# Patient Record
Sex: Male | Born: 1935 | Race: White | Hispanic: No | Marital: Married | Smoking: Former smoker
Health system: Southern US, Community
[De-identification: ages and names within clinical notes are randomized; demographics above are authoritative.]

## PROBLEM LIST (undated history)

## (undated) DIAGNOSIS — Z8673 Personal history of transient ischemic attack (TIA), and cerebral infarction without residual deficits: Secondary | ICD-10-CM

## (undated) DIAGNOSIS — E785 Hyperlipidemia, unspecified: Secondary | ICD-10-CM

## (undated) DIAGNOSIS — N400 Enlarged prostate without lower urinary tract symptoms: Secondary | ICD-10-CM

## (undated) DIAGNOSIS — N200 Calculus of kidney: Secondary | ICD-10-CM

## (undated) DIAGNOSIS — R413 Other amnesia: Secondary | ICD-10-CM

## (undated) DIAGNOSIS — G9689 Other specified disorders of central nervous system: Secondary | ICD-10-CM

## (undated) DIAGNOSIS — F4310 Post-traumatic stress disorder, unspecified: Secondary | ICD-10-CM

## (undated) DIAGNOSIS — I6789 Other cerebrovascular disease: Secondary | ICD-10-CM

## (undated) HISTORY — DX: Other specified disorders of central nervous system: G96.89

## (undated) HISTORY — DX: Other amnesia: R41.3

## (undated) HISTORY — DX: Hyperlipidemia, unspecified: E78.5

## (undated) HISTORY — DX: Benign prostatic hyperplasia without lower urinary tract symptoms: N40.0

## (undated) HISTORY — PX: TONSILLECTOMY: SUR1361

## (undated) HISTORY — DX: Other cerebrovascular disease: I67.89

## (undated) HISTORY — PX: KNEE SURGERY: SHX244

## (undated) HISTORY — DX: Calculus of kidney: N20.0

## (undated) HISTORY — DX: Post-traumatic stress disorder, unspecified: F43.10

## (undated) HISTORY — DX: Personal history of transient ischemic attack (TIA), and cerebral infarction without residual deficits: Z86.73

---

## 2000-06-09 ENCOUNTER — Encounter: Payer: Self-pay | Admitting: *Deleted

## 2000-06-09 ENCOUNTER — Encounter: Admission: RE | Admit: 2000-06-09 | Discharge: 2000-06-09 | Payer: Self-pay | Admitting: *Deleted

## 2004-03-20 ENCOUNTER — Ambulatory Visit (HOSPITAL_COMMUNITY): Admission: RE | Admit: 2004-03-20 | Discharge: 2004-03-20 | Payer: Self-pay | Admitting: Internal Medicine

## 2004-05-07 ENCOUNTER — Ambulatory Visit (HOSPITAL_COMMUNITY): Admission: RE | Admit: 2004-05-07 | Discharge: 2004-05-07 | Payer: Self-pay | Admitting: Urology

## 2005-07-09 ENCOUNTER — Ambulatory Visit: Payer: Self-pay | Admitting: Internal Medicine

## 2005-07-26 ENCOUNTER — Ambulatory Visit: Payer: Self-pay | Admitting: Internal Medicine

## 2005-08-02 ENCOUNTER — Ambulatory Visit: Payer: Self-pay | Admitting: Internal Medicine

## 2005-10-25 ENCOUNTER — Ambulatory Visit: Payer: Self-pay | Admitting: Internal Medicine

## 2005-10-27 ENCOUNTER — Ambulatory Visit: Payer: Self-pay | Admitting: Internal Medicine

## 2007-11-14 ENCOUNTER — Ambulatory Visit: Payer: Self-pay | Admitting: Internal Medicine

## 2007-11-14 DIAGNOSIS — N400 Enlarged prostate without lower urinary tract symptoms: Secondary | ICD-10-CM | POA: Insufficient documentation

## 2007-11-14 DIAGNOSIS — M109 Gout, unspecified: Secondary | ICD-10-CM

## 2007-11-14 DIAGNOSIS — E785 Hyperlipidemia, unspecified: Secondary | ICD-10-CM | POA: Insufficient documentation

## 2008-07-18 ENCOUNTER — Encounter: Payer: Self-pay | Admitting: Internal Medicine

## 2008-09-17 ENCOUNTER — Ambulatory Visit (HOSPITAL_BASED_OUTPATIENT_CLINIC_OR_DEPARTMENT_OTHER): Admission: RE | Admit: 2008-09-17 | Discharge: 2008-09-17 | Payer: Self-pay | Admitting: Orthopedic Surgery

## 2009-11-12 ENCOUNTER — Ambulatory Visit: Payer: Self-pay | Admitting: Internal Medicine

## 2009-11-12 DIAGNOSIS — R0989 Other specified symptoms and signs involving the circulatory and respiratory systems: Secondary | ICD-10-CM

## 2009-11-12 DIAGNOSIS — Z87891 Personal history of nicotine dependence: Secondary | ICD-10-CM

## 2009-11-13 ENCOUNTER — Ambulatory Visit: Payer: Self-pay | Admitting: Internal Medicine

## 2009-11-17 LAB — CONVERTED CEMR LAB
ALT: 21 units/L (ref 0–53)
AST: 24 units/L (ref 0–37)
Albumin: 3.9 g/dL (ref 3.5–5.2)
Alkaline Phosphatase: 65 units/L (ref 39–117)
BUN: 16 mg/dL (ref 6–23)
Basophils Absolute: 0 10*3/uL (ref 0.0–0.1)
Basophils Relative: 0.5 % (ref 0.0–3.0)
Bilirubin, Direct: 0.1 mg/dL (ref 0.0–0.3)
CO2: 27 meq/L (ref 19–32)
Calcium: 9.4 mg/dL (ref 8.4–10.5)
Chloride: 111 meq/L (ref 96–112)
Cholesterol: 221 mg/dL — ABNORMAL HIGH (ref 0–200)
Creatinine, Ser: 1.1 mg/dL (ref 0.4–1.5)
Direct LDL: 147.3 mg/dL
Eosinophils Absolute: 0.1 10*3/uL (ref 0.0–0.7)
Eosinophils Relative: 1.9 % (ref 0.0–5.0)
GFR calc non Af Amer: 70.1 mL/min (ref >60)
Glucose, Bld: 97 mg/dL (ref 70–99)
HCT: 47.1 % (ref 39.0–52.0)
HDL: 48.7 mg/dL (ref >39.00)
Hemoglobin: 15.2 g/dL (ref 13.0–17.0)
Lymphocytes Relative: 18.7 % (ref 12.0–46.0)
Lymphs Abs: 1 10*3/uL (ref 0.7–4.0)
MCHC: 32.3 g/dL (ref 30.0–36.0)
MCV: 99.2 fL (ref 78.0–100.0)
Monocytes Absolute: 0.5 10*3/uL (ref 0.1–1.0)
Monocytes Relative: 9.5 % (ref 3.0–12.0)
Neutro Abs: 3.7 10*3/uL (ref 1.4–7.7)
Neutrophils Relative %: 69.4 % (ref 43.0–77.0)
Platelets: 212 10*3/uL (ref 150.0–400.0)
Potassium: 3.9 meq/L (ref 3.5–5.1)
RBC: 4.75 M/uL (ref 4.22–5.81)
RDW: 12.7 % (ref 11.5–14.6)
Sodium: 145 meq/L (ref 135–145)
TSH: 2.5 microintl units/mL (ref 0.35–5.50)
Total Bilirubin: 1 mg/dL (ref 0.3–1.2)
Total CHOL/HDL Ratio: 5
Total Protein: 7.5 g/dL (ref 6.0–8.3)
Triglycerides: 96 mg/dL (ref 0.0–149.0)
VLDL: 19.2 mg/dL (ref 0.0–40.0)
WBC: 5.3 10*3/uL (ref 4.5–10.5)

## 2009-11-21 ENCOUNTER — Encounter: Payer: Self-pay | Admitting: Internal Medicine

## 2009-11-25 ENCOUNTER — Ambulatory Visit: Payer: Self-pay

## 2009-11-26 ENCOUNTER — Encounter: Payer: Self-pay | Admitting: Internal Medicine

## 2009-12-15 ENCOUNTER — Encounter: Payer: Self-pay | Admitting: Internal Medicine

## 2010-11-01 ENCOUNTER — Encounter: Payer: Self-pay | Admitting: Internal Medicine

## 2010-11-03 ENCOUNTER — Encounter: Payer: Self-pay | Admitting: Internal Medicine

## 2010-11-04 ENCOUNTER — Encounter: Payer: Self-pay | Admitting: Internal Medicine

## 2010-11-10 ENCOUNTER — Encounter
Admission: RE | Admit: 2010-11-10 | Discharge: 2010-11-10 | Payer: Self-pay | Source: Home / Self Care | Attending: Gastroenterology | Admitting: Gastroenterology

## 2010-12-06 LAB — CONVERTED CEMR LAB
ALT: 22 units/L (ref 0–53)
AST: 25 units/L (ref 0–37)
Albumin: 3.9 g/dL (ref 3.5–5.2)
Alkaline Phosphatase: 58 units/L (ref 39–117)
BUN: 18 mg/dL (ref 6–23)
Bacteria, UA: NEGATIVE
Basophils Absolute: 0 10*3/uL (ref 0.0–0.1)
Basophils Relative: 0.7 % (ref 0.0–1.0)
Bilirubin Urine: NEGATIVE
Bilirubin, Direct: 0.2 mg/dL (ref 0.0–0.3)
CO2: 28 meq/L (ref 19–32)
Calcium: 9 mg/dL (ref 8.4–10.5)
Chloride: 105 meq/L (ref 96–112)
Cholesterol: 201 mg/dL (ref 0–200)
Creatinine, Ser: 1 mg/dL (ref 0.4–1.5)
Crystals: NEGATIVE
Direct LDL: 119.2 mg/dL
Eosinophils Absolute: 0.1 10*3/uL (ref 0.0–0.6)
Eosinophils Relative: 2.9 % (ref 0.0–5.0)
GFR calc Af Amer: 95 mL/min
GFR calc non Af Amer: 79 mL/min
Glucose, Bld: 104 mg/dL — ABNORMAL HIGH (ref 70–99)
HCT: 43.9 % (ref 39.0–52.0)
HDL: 39.2 mg/dL (ref >39.0)
Hemoglobin, Urine: NEGATIVE
Hemoglobin: 15.1 g/dL (ref 13.0–17.0)
Ketones, ur: NEGATIVE mg/dL
Leukocytes, UA: NEGATIVE
Lymphocytes Relative: 11.6 % — ABNORMAL LOW (ref 12.0–46.0)
MCHC: 34.5 g/dL (ref 30.0–36.0)
MCV: 94.6 fL (ref 78.0–100.0)
Monocytes Absolute: 0.7 10*3/uL (ref 0.2–0.7)
Monocytes Relative: 13.2 % — ABNORMAL HIGH (ref 3.0–11.0)
Neutro Abs: 3.7 10*3/uL (ref 1.4–7.7)
Neutrophils Relative %: 71.6 % (ref 43.0–77.0)
Nitrite: NEGATIVE
PSA: 0.82 ng/mL (ref 0.10–4.00)
Platelets: 212 10*3/uL (ref 150–400)
Potassium: 3.8 meq/L (ref 3.5–5.1)
RBC / HPF: NONE SEEN
RBC: 4.64 M/uL (ref 4.22–5.81)
RDW: 12.8 % (ref 11.5–14.6)
Sodium: 139 meq/L (ref 135–145)
Specific Gravity, Urine: >=1.03 (ref 1.000–1.03)
TSH: 1.97 microintl units/mL (ref 0.35–5.50)
Total Bilirubin: 0.9 mg/dL (ref 0.3–1.2)
Total CHOL/HDL Ratio: 5.1
Total Protein, Urine: NEGATIVE mg/dL
Total Protein: 7.1 g/dL (ref 6.0–8.3)
Triglycerides: 108 mg/dL (ref 0–149)
Urine Glucose: NEGATIVE mg/dL
Urobilinogen, UA: 0.2 (ref 0.0–1.0)
VLDL: 22 mg/dL (ref 0–40)
WBC: 5.1 10*3/uL (ref 4.5–10.5)
pH: 5.5 (ref 5.0–8.0)

## 2010-12-08 NOTE — Miscellaneous (Signed)
Summary: Orders Update  Clinical Lists Changes  Orders: Added new Test order of Carotid Duplex (Carotid Duplex) - Signed 

## 2010-12-08 NOTE — Assessment & Plan Note (Signed)
Summary: COMPLETE PHYSICAL-LB   Vital Signs:  Patient profile:   75 year old male Height:      70 inches Weight:      207 pounds BMI:     29.81 Temp:     98.3 degrees F oral Pulse rate:   68 / minute BP sitting:   120 / 80  (left arm) CC: cpx Is Patient Diabetic? No   CC:  cpx.  History of Present Illness: The patient presents for a wellness examination   Preventive Screening-Counseling & Management  Alcohol-Tobacco     Smoking Status: quit  Caffeine-Diet-Exercise     Does Patient Exercise: yes  Current Medications (verified): 1)  Flomax 0.4 Mg Cp24 (Tamsulosin Hcl) .... Take 1 Capsule By Mouth Daily 2)  Vitamin D3 1000 Unit  Tabs (Cholecalciferol) .Marland Kitchen.. 1 Qd 3)  Aspir-Low 81 Mg Tbec (Aspirin) .Marland Kitchen.. 1 Once Daily After Meal 4)  Fish Oil   Oil (Fish Oil) .Marland Kitchen.. 1 By Mouth Bid  Allergies: 1)  Lovastatin (Lovastatin)  Past History:  Past Medical History: Last updated: 11/14/2007 Kidney stones   Dr Serena Colonel Gout Hyperlipidemia (intol. of statins) Benign prostatic hypertrophy    GI   Dr Kinnie Scales  Family History: Last updated: 11/14/2007 Family History of Colon CA 1st degree relative <60 F Alzheimer  Past Surgical History: Denies surgical history  Social History: Retired Librarian, academic Married Never Smoked Regular exercise-yes Smoking Status:  quit Does Patient Exercise:  yes  Review of Systems  The patient denies anorexia, fever, weight loss, weight gain, vision loss, decreased hearing, hoarseness, chest pain, syncope, dyspnea on exertion, peripheral edema, prolonged cough, headaches, hemoptysis, abdominal pain, melena, hematochezia, severe indigestion/heartburn, hematuria, incontinence, genital sores, muscle weakness, suspicious skin lesions, transient blindness, difficulty walking, depression, unusual weight change, abnormal bleeding, enlarged lymph nodes, angioedema, and testicular masses.    Physical Exam  General:  Well-developed,well-nourished,in  no acute distress; alert,appropriate and cooperative throughout examination Head:  Normocephalic and atraumatic without obvious abnormalities. No apparent alopecia or balding. Eyes:  No corneal or conjunctival inflammation noted. EOMI. Perrla. Ears:  External ear exam shows no significant lesions or deformities.  Otoscopic examination reveals clear canals, tympanic membranes are intact bilaterally without bulging, retraction, inflammation or discharge. Hearing is grossly normal bilaterally. Nose:  External nasal examination shows no deformity or inflammation. Nasal mucosa are pink and moist without lesions or exudates. Mouth:  Oral mucosa and oropharynx without lesions or exudates.  Teeth in good repair. Neck:  mild bruit B Lungs:  Normal respiratory effort, chest expands symmetrically. Lungs are clear to auscultation, no crackles or wheezes. Heart:  Normal rate and regular rhythm. S1 and S2 normal without gallop, murmur, click, rub or other extra sounds. Abdomen:  Bowel sounds positive,abdomen soft and non-tender without masses, organomegaly or hernias noted. Rectal:  seeing urol Genitalia:  per urol Prostate:  per urol Msk:  No deformity or scoliosis noted of thoracic or lumbar spine.   Pulses:  R and L carotid,radial,femoral,dorsalis pedis and posterior tibial pulses are full and equal bilaterally Extremities:  No clubbing, cyanosis, edema, or deformity noted with normal full range of motion of all joints.   Neurologic:  No cranial nerve deficits noted. Station and gait are normal. Plantar reflexes are down-going bilaterally. DTRs are symmetrical throughout. Sensory, motor and coordinative functions appear intact. Skin:  Intact without suspicious lesions or rashes Cervical Nodes:  No lymphadenopathy noted Inguinal Nodes:  No significant adenopathy Psych:  Cognition and judgment appear intact. Alert and cooperative  with normal attention span and concentration. No apparent delusions, illusions,  hallucinations   Impression & Recommendations:  Problem # 1:  WELL ADULT EXAM (ICD-V70.0) Assessment New The labs were ordered.  Health and age related issues were discussed. Available screening tests and vaccinations were discussed as well. Healthy life style including good diet and execise was discussed.  Colon Dr Kinnie Scales  Orders: EKG w/ Interpretation (93000)  Problem # 2:  BENIGN PROSTATIC HYPERTROPHY (ICD-600.00) Assessment: Unchanged Appt w/Dr K next wk is pending  His updated medication list for this problem includes:    Flomax 0.4 Mg Cp24 (Tamsulosin hcl) .Marland Kitchen... Take 1 capsule by mouth daily  Problem # 3:  HYPERLIPIDEMIA (ICD-272.4) mild Assessment: Comment Only Good diet  Complete Medication List: 1)  Flomax 0.4 Mg Cp24 (Tamsulosin hcl) .... Take 1 capsule by mouth daily 2)  Vitamin D3 1000 Unit Tabs (Cholecalciferol) .Marland Kitchen.. 1 qd 3)  Aspir-low 81 Mg Tbec (Aspirin) .Marland Kitchen.. 1 once daily after meal 4)  Fish Oil Oil (Fish oil) .Marland Kitchen.. 1 by mouth bid  Other Orders: Doppler Referral (Doppler)  Contraindications/Deferment of Procedures/Staging:    Test/Procedure: DPT vaccine    Reason for deferment: declined   Patient Instructions: 1)  Try to eat more raw plant food, fresh and dry fruit, raw almonds, leafy vegetables, whole foods and less red meat, less animal fat. Poultry and fish is better for you than pork and beef. Avoid processed foods (canned soups, hot dogs, sausage, bacon , frozen dinners). Avoid corn syrup, high fructose syrup or aspartam and Splenda  containing drinks. Honey, Agave and Stevia are better sweeteners. Make your own  dressing with olive oil, wine vinegar, lemon juce, garlic etc. for your salads. 2)  Labs tomorrow: 3)  BMP prior to visit, ICD-9: 4)  Hepatic Panel prior to visit, ICD-9: 5)  Lipid Panel prior to visit, ICD-9: 6)  TSH prior to visit, ICD-9: v70.0 7)  CBC w/ Diff prior to visit, ICD-9:

## 2010-12-08 NOTE — Letter (Signed)
Summary: [facility]  [facility]   Imported By: Sherian Rein 12/24/2009 10:26:56  _____________________________________________________________________  External Attachment:    Type:   Image     Comment:   External Document

## 2010-12-10 NOTE — Letter (Signed)
Summary: Currie Paris MD  [facility]rie Paris MD   Imported By: Lester Staunton 11/23/2010 09:12:39  _____________________________________________________________________  External Attachment:    Type:   Image     Comment:   External Document

## 2010-12-10 NOTE — Letter (Signed)
Summary: [facility]   Imported By: Lennie Odor 11/25/2010 12:03:04  _____________________________________________________________________  External Attachment:    Type:   Image     Comment:   External Document

## 2010-12-16 ENCOUNTER — Encounter: Payer: Self-pay | Admitting: Internal Medicine

## 2010-12-30 NOTE — Letter (Signed)
Summary: [facility]   Imported By: Sherian Rein 12/23/2010 08:40:55  _____________________________________________________________________  External Attachment:    Type:   Image     Comment:   External Document

## 2011-03-23 NOTE — Op Note (Signed)
Jacob Figueroa, ROSENCRANS                  ACCOUNT NO.:  [id number]   MEDICAL RECORD NO.:  [id number]          PATIENT TYPE:  AMB   LOCATION:  [facility]                          FACILITY:  [facility]   PHYSICIAN:  Robert A. Thurston Hole, M.D. DATE OF BIRTH:  [birth year]   DATE OF PROCEDURE:  DATE OF DISCHARGE:                               OPERATIVE REPORT   PREOPERATIVE DIAGNOSIS:  Left knee medial and lateral meniscal tears.   POSTOPERATIVE DIAGNOSIS:  Left knee medial and lateral meniscal tears.   PROCEDURE:  Left knee examination under anesthesia followed by  arthroscopic partial medial and lateral meniscectomies.   SURGEON:  Elana Alm. Thurston Hole, M.D.   ANESTHESIA:  General.   OPERATIVE TIME:  30 minutes.   COMPLICATIONS:  None.   INDICATION FOR PROCEDURE:  Mr. Cowher is a 75 year old gentleman who has  had significant left knee pain for the past 4-5 months, increasing in  nature with exam and MRI documenting meniscal tearing.  He has failed  conservative care and is now to undergo arthroscopy.   DESCRIPTION:  Mr. Streett was brought to the operating room on September 17, 2008, placed on operating table in supine position.  After adequate  level of general anesthesia was obtained, his left knee was examined.  He had full range of motion.  His knee was stable to ligamentous exam  with normal patellar tracking.  The knee was sterilely injected with  0.25% Marcaine with epinephrine.  The left leg was prepped using sterile  DuraPrep and draped using sterile technique.  Originally, through an  anterolateral portal, the arthroscope with a pump attached was placed  and through an anteromedial portal, an arthroscopic probe was placed.  On initial inspection of the medial compartment, he had grade 1 and 2  chondromalacia.  Medial meniscus showed a complex tear in the posterior  and medial horn, of which 50% was resected back to stable, but  degenerative rim.  Intercondylar notch inspected.  Anterior and  posterior cruciate ligaments were normal.  Lateral compartment was  inspected.  The articular cartilage was normal.  Lateral meniscus showed  a small partial tear of 15% in posterolateral corner, which was resected  back to a stable rim.  Patellofemoral joint and articular cartilage  showed grade 1 and 2 chondromalacia.  The patella tracked normally.  Mild synovitis in the medial gutter was debrided, otherwise, the medial  and lateral gutters were free of pathology, normal patella tracking was  noted.  After this was done, it was felt that all pathology have been  satisfactorily addressed.  The instruments were removed.  The portals  were closed with 3-0 nylon suture and injected with 0.25% Marcaine with  epinephrine and 4 mg of morphine.  Sterile dressings were applied, and  the patient awakened and taken to the recovery room in stable condition.   FOLLOWUP CARE:  Mr. Moscato will be followed as an outpatient on Vicodin  and Mobic.  He will be seen back in the office in a week for sutures out  and followup.      Robert A.  Thurston Hole, M.D.  Electronically Signed     RAW/MEDQ  D:  09/17/2008  T:  09/18/2008  Job:  [id number]

## 2011-09-02 ENCOUNTER — Telehealth: Payer: Self-pay | Admitting: *Deleted

## 2011-09-02 DIAGNOSIS — Z0389 Encounter for observation for other suspected diseases and conditions ruled out: Secondary | ICD-10-CM

## 2011-09-02 DIAGNOSIS — Z0000 Encounter for general adult medical examination without abnormal findings: Secondary | ICD-10-CM

## 2011-09-02 NOTE — Telephone Encounter (Signed)
Labs entered.

## 2011-09-02 NOTE — Telephone Encounter (Signed)
Message copied by Merrilyn Puma on Thu Sep 02, 2011  9:49 AM ------      Message from: Earl Lagos      Created: Wed Sep 01, 2011  4:56 PM      Regarding: labs       Please schedule labs for cpx 11/08/11. Thanks

## 2011-10-11 ENCOUNTER — Other Ambulatory Visit (INDEPENDENT_AMBULATORY_CARE_PROVIDER_SITE_OTHER): Payer: Self-pay

## 2011-10-11 ENCOUNTER — Other Ambulatory Visit: Payer: Self-pay | Admitting: Internal Medicine

## 2011-10-11 DIAGNOSIS — Z0000 Encounter for general adult medical examination without abnormal findings: Secondary | ICD-10-CM

## 2011-10-11 DIAGNOSIS — Z0389 Encounter for observation for other suspected diseases and conditions ruled out: Secondary | ICD-10-CM

## 2011-10-11 LAB — BASIC METABOLIC PANEL
BUN: 15 mg/dL (ref 6–23)
CO2: 27 mEq/L (ref 19–32)
Chloride: 106 mEq/L (ref 96–112)
Creatinine, Ser: 0.9 mg/dL (ref 0.4–1.5)
Glucose, Bld: 96 mg/dL (ref 70–99)
Potassium: 4 mEq/L (ref 3.5–5.1)

## 2011-10-11 LAB — URINALYSIS, ROUTINE W REFLEX MICROSCOPIC
Hgb urine dipstick: NEGATIVE
Total Protein, Urine: NEGATIVE
Urine Glucose: NEGATIVE
WBC, UA: NONE SEEN (ref 0–?)
pH: 6 (ref 5.0–8.0)

## 2011-10-11 LAB — CBC WITH DIFFERENTIAL/PLATELET
Basophils Relative: 0.2 % (ref 0.0–3.0)
Eosinophils Absolute: 0.1 10*3/uL (ref 0.0–0.7)
Eosinophils Relative: 1.1 % (ref 0.0–5.0)
Hemoglobin: 14.2 g/dL (ref 13.0–17.0)
Lymphocytes Relative: 14.1 % (ref 12.0–46.0)
MCHC: 33.9 g/dL (ref 30.0–36.0)
Monocytes Relative: 7.7 % (ref 3.0–12.0)
Neutro Abs: 4.6 10*3/uL (ref 1.4–7.7)
RBC: 4.36 Mil/uL (ref 4.22–5.81)
WBC: 6 10*3/uL (ref 4.5–10.5)

## 2011-10-11 LAB — HEPATIC FUNCTION PANEL
AST: 30 U/L (ref 0–37)
Alkaline Phosphatase: 63 U/L (ref 39–117)
Bilirubin, Direct: 0.1 mg/dL (ref 0.0–0.3)
Total Bilirubin: 0.5 mg/dL (ref 0.3–1.2)

## 2011-10-11 LAB — LIPID PANEL
Total CHOL/HDL Ratio: 4
VLDL: 38.4 mg/dL (ref 0.0–40.0)

## 2011-11-05 ENCOUNTER — Encounter: Payer: Self-pay | Admitting: Internal Medicine

## 2011-11-08 ENCOUNTER — Ambulatory Visit (INDEPENDENT_AMBULATORY_CARE_PROVIDER_SITE_OTHER): Payer: Medicare Other | Admitting: Internal Medicine

## 2011-11-08 ENCOUNTER — Encounter: Payer: Self-pay | Admitting: Internal Medicine

## 2011-11-08 VITALS — BP 140/88 | HR 80 | Temp 97.5°F | Resp 16 | Ht 70.0 in | Wt 197.0 lb

## 2011-11-08 DIAGNOSIS — Z0000 Encounter for general adult medical examination without abnormal findings: Secondary | ICD-10-CM

## 2011-11-08 DIAGNOSIS — N400 Enlarged prostate without lower urinary tract symptoms: Secondary | ICD-10-CM

## 2011-11-08 DIAGNOSIS — R030 Elevated blood-pressure reading, without diagnosis of hypertension: Secondary | ICD-10-CM

## 2011-11-08 DIAGNOSIS — Z136 Encounter for screening for cardiovascular disorders: Secondary | ICD-10-CM

## 2011-11-08 DIAGNOSIS — Z23 Encounter for immunization: Secondary | ICD-10-CM

## 2011-11-08 MED ORDER — ALLOPURINOL 100 MG PO TABS: 100.0000 mg | ORAL_TABLET | Freq: Every day | ORAL | Status: DC

## 2011-11-08 NOTE — Assessment & Plan Note (Signed)
Cut back on ETOH

## 2011-11-08 NOTE — Progress Notes (Signed)
Subjective:    Patient ID: Jacob Figueroa, male    DOB: 02-27-1936, 75 y.o.   MRN: [id number]  HPI  He The patient is here for a wellness exam. The patient has been doing well overall without major physical or psychological issues going on lately.  The patient needs to address  chronic BPH   Review of Systems  Constitutional: Negative for appetite change, fatigue and unexpected weight change.  HENT: Negative for nosebleeds, congestion, sore throat, sneezing, trouble swallowing and neck pain.   Eyes: Negative for itching and visual disturbance.  Respiratory: Negative for cough.   Cardiovascular: Negative for chest pain, palpitations and leg swelling.  Gastrointestinal: Negative for nausea, diarrhea, blood in stool and abdominal distention.  Genitourinary: Negative for frequency and hematuria.  Musculoskeletal: Negative for back pain, joint swelling and gait problem.  Skin: Negative for rash.  Neurological: Negative for dizziness, tremors, speech difficulty and weakness.  Psychiatric/Behavioral: Negative for sleep disturbance, dysphoric mood and agitation. The patient is not nervous/anxious.    Wt Readings from Last 3 Encounters:  11/08/11 197 lb (89.359 kg)  11/12/09 207 lb (93.895 kg)  11/14/07 214 lb (97.07 kg)       Objective:   Physical Exam  Constitutional: He is oriented to person, place, and time. He appears well-developed and well-nourished. No distress.  HENT:  Head: Normocephalic and atraumatic.  Right Ear: External ear normal.  Left Ear: External ear normal.  Nose: Nose normal.  Mouth/Throat: Oropharynx is clear and moist. No oropharyngeal exudate.  Eyes: Conjunctivae and EOM are normal. Pupils are equal, round, and reactive to light. Right eye exhibits no discharge. Left eye exhibits no discharge. No scleral icterus.  Neck: Normal range of motion. Neck supple. No JVD present. No tracheal deviation present. No thyromegaly present.  Cardiovascular: Normal rate,  regular rhythm, normal heart sounds and intact distal pulses.  Exam reveals no gallop and no friction rub.   No murmur heard. Pulmonary/Chest: Effort normal and breath sounds normal. No stridor. No respiratory distress. He has no wheezes. He has no rales. He exhibits no tenderness.  Abdominal: Soft. Bowel sounds are normal. He exhibits no distension and no mass. There is no tenderness. There is no rebound and no guarding.  Genitourinary: Rectum normal and penis normal. Guaiac negative stool. No penile tenderness.       Prostate 1+  Musculoskeletal: Normal range of motion. He exhibits no edema and no tenderness.  Lymphadenopathy:    He has no cervical adenopathy.  Neurological: He is alert and oriented to person, place, and time. He has normal reflexes. No cranial nerve deficit. He exhibits normal muscle tone. Coordination normal.  Skin: Skin is warm and dry. No rash noted. He is not diaphoretic. No erythema. No pallor.  Psychiatric: He has a normal mood and affect. His behavior is normal. Judgment and thought content normal.     BP Readings from Last 3 Encounters:  11/08/11 140/88  11/12/09 120/80  11/14/07 116/74   Lab Results  Component Value Date   WBC 6.0 10/11/2011   HGB 14.2 10/11/2011   HCT 41.9 10/11/2011   PLT 207.0 10/11/2011   GLUCOSE 96 10/11/2011   CHOL 208* 10/11/2011   TRIG 192.0* 10/11/2011   HDL 49.70 10/11/2011   LDLDIRECT 118.8 10/11/2011   ALT 17 10/11/2011   AST 30 10/11/2011   NA 140 10/11/2011   K 4.0 10/11/2011   CL 106 10/11/2011   CREATININE 0.9 10/11/2011   BUN 15 10/11/2011  CO2 27 10/11/2011   TSH 2.39 10/11/2011   PSA 0.74 10/11/2011        Assessment & Plan:

## 2012-01-21 ENCOUNTER — Encounter: Payer: Self-pay | Admitting: Internal Medicine

## 2012-01-21 NOTE — Assessment & Plan Note (Signed)
Continue with current prescription therapy as reflected on the Med list.  

## 2012-01-21 NOTE — Assessment & Plan Note (Signed)
12/12 The patient is here for annual Medicare wellness examination and management of other chronic and acute problems.   The risk factors are reflected in the social history.  The roster of all physicians providing medical care to patient - is listed in the Snapshot section of the chart.  Activities of daily living:  The patient is 100% inedpendent in all ADLs: dressing, toileting, feeding as well as independent mobility  Home safety : The patient has smoke detectors in the home. They wear seatbelts. Firearms are present in the home, kept in a safe fashion. There is no violence in the home.   There is no risks for hepatitis, STDs or HIV. There is no   history of blood transfusion. They have no travel history to infectious disease endemic areas of the world.  The patient has (has not) seen their dentist in the last six month. They have (not) seen their eye doctor in the last year. They deny (admit to) any hearing difficulty and have not had audiologic testing in the last year.  They do not  have excessive sun exposure. Discussed the need for sun protection: hats, long sleeves and use of sunscreen if there is significant sun exposure.   Diet: the importance of a healthy diet is discussed. They do have a healthy (unhealthy-high fat/fast food) diet.  The patient has a regular exercise program: 7per week.  The benefits of regular aerobic exercise were discussed.  Depression screen: there are no signs or vegative symptoms of depression- irritability, change in appetite, anhedonia, sadness/tearfullness.  Cognitive assessment: the patient manages all their financial and personal affairs and is actively engaged. They could relate day,date,year and events; recalled 3/3 objects at 3 minutes; performed clock-face test normally.  The following portions of the patient's history were reviewed and updated as appropriate: allergies, current medications, past family history, past medical history,  past surgical  history, past social history  and problem list.  Vision, hearing, body mass index were assessed and reviewed.   During the course of the visit the patient was educated and counseled about appropriate screening and preventive services including : fall prevention , diabetes screening, nutrition counseling, colorectal cancer screening, and recommended immunizations.  We discussed alcohol use - limit to 1-2/d

## 2012-04-19 DIAGNOSIS — N200 Calculus of kidney: Secondary | ICD-10-CM | POA: Diagnosis not present

## 2012-04-19 DIAGNOSIS — N139 Obstructive and reflux uropathy, unspecified: Secondary | ICD-10-CM | POA: Diagnosis not present

## 2012-04-19 DIAGNOSIS — N529 Male erectile dysfunction, unspecified: Secondary | ICD-10-CM | POA: Diagnosis not present

## 2012-04-19 DIAGNOSIS — Q619 Cystic kidney disease, unspecified: Secondary | ICD-10-CM | POA: Diagnosis not present

## 2012-06-07 ENCOUNTER — Encounter: Payer: Self-pay | Admitting: Internal Medicine

## 2012-06-07 ENCOUNTER — Ambulatory Visit (INDEPENDENT_AMBULATORY_CARE_PROVIDER_SITE_OTHER): Payer: Medicare Other | Admitting: Internal Medicine

## 2012-06-07 VITALS — BP 140/82 | HR 76 | Temp 98.4°F | Resp 16 | Wt 190.0 lb

## 2012-06-07 DIAGNOSIS — R030 Elevated blood-pressure reading, without diagnosis of hypertension: Secondary | ICD-10-CM | POA: Diagnosis not present

## 2012-06-07 DIAGNOSIS — Z7289 Other problems related to lifestyle: Secondary | ICD-10-CM | POA: Diagnosis not present

## 2012-06-07 DIAGNOSIS — F4310 Post-traumatic stress disorder, unspecified: Secondary | ICD-10-CM

## 2012-06-07 DIAGNOSIS — F22 Delusional disorders: Secondary | ICD-10-CM | POA: Diagnosis not present

## 2012-06-07 MED ORDER — QUETIAPINE FUMARATE ER 50 MG PO TB24: 50.0000 mg | ORAL_TABLET | Freq: Every day | ORAL | Status: DC

## 2012-06-07 MED ORDER — LORAZEPAM 0.5 MG PO TABS: 0.5000 mg | ORAL_TABLET | Freq: Three times a day (TID) | ORAL | Status: AC | PRN

## 2012-06-07 MED ORDER — THIAMINE HCL 100 MG PO TABS: 100.0000 mg | ORAL_TABLET | Freq: Every day | ORAL | Status: DC

## 2012-06-07 NOTE — Assessment & Plan Note (Signed)
He is seeing a psychologist w/wife starting 06/06/12 No alcohol Lorazepam prn

## 2012-06-07 NOTE — Assessment & Plan Note (Signed)
Mild. Will watch 

## 2012-06-07 NOTE — Assessment & Plan Note (Signed)
Thiamine short term Lorazepam prn Cont w/councelling

## 2012-06-07 NOTE — Assessment & Plan Note (Addendum)
New 7/13 PTSD Start Seroquel XR at low dose Psychiatry consult Cont w/councelling Wife will watch him closely

## 2012-06-07 NOTE — Progress Notes (Signed)
  Subjective:    Patient ID: Jacob Figueroa, male    DOB: 1936/02/16, 76 y.o.   MRN: [id number]  HPI  C/o paranoia, agitation, anxiety H/o  PTSD  C/o elevated levels of anxiety and agitation over past 7-10 d. He thought someone would harm him. He had a shot gun prepared. He denies hearing voices or seeing things or messages. He started to drink more for a few days - it made things worse. He had his last drink on Fri last week. No shakes or craving.  No HAs, gait issues, dizziness    Review of Systems  Constitutional: Negative for appetite change, fatigue and unexpected weight change.  HENT: Negative for nosebleeds, congestion, sore throat, sneezing, trouble swallowing and neck pain.   Eyes: Negative for itching and visual disturbance.  Respiratory: Negative for cough.   Cardiovascular: Negative for chest pain, palpitations and leg swelling.  Gastrointestinal: Negative for nausea, diarrhea, blood in stool and abdominal distention.  Genitourinary: Negative for frequency and hematuria.  Musculoskeletal: Negative for back pain, joint swelling and gait problem.  Skin: Negative for rash.  Neurological: Negative for dizziness, tremors, seizures, syncope, speech difficulty, weakness, light-headedness, numbness and headaches.  Hematological: Does not bruise/bleed easily.  Psychiatric/Behavioral: Positive for behavioral problems and decreased concentration. Negative for suicidal ideas, hallucinations, confusion, disturbed wake/sleep cycle, self-injury, dysphoric mood and agitation. The patient is nervous/anxious and is hyperactive.        Objective:   Physical Exam  Constitutional: He is oriented to person, place, and time. He appears well-developed.  HENT:  Mouth/Throat: Oropharynx is clear and moist.  Eyes: Conjunctivae are normal. Pupils are equal, round, and reactive to light.  Neck: Normal range of motion. No JVD present. No thyromegaly present.  Cardiovascular: Normal rate, regular  rhythm, normal heart sounds and intact distal pulses.  Exam reveals no gallop and no friction rub.   No murmur heard. Pulmonary/Chest: Effort normal and breath sounds normal. No respiratory distress. He has no wheezes. He has no rales. He exhibits no tenderness.  Abdominal: Soft. Bowel sounds are normal. He exhibits no distension and no mass. There is no tenderness. There is no rebound and no guarding.  Musculoskeletal: Normal range of motion. He exhibits no edema and no tenderness.  Lymphadenopathy:    He has no cervical adenopathy.  Neurological: He is alert and oriented to person, place, and time. He has normal reflexes. No cranial nerve deficit. He exhibits normal muscle tone. Coordination normal.       A very mild hand tremor  Skin: Skin is warm and dry. No rash noted.  Psychiatric: His behavior is normal. Judgment and thought content normal.       Looks sad, quiet Not suicidal or homicidal          Assessment & Plan:

## 2012-06-08 ENCOUNTER — Other Ambulatory Visit: Payer: Self-pay | Admitting: *Deleted

## 2012-06-08 ENCOUNTER — Telehealth: Payer: Self-pay | Admitting: *Deleted

## 2012-06-08 MED ORDER — THIAMINE HCL 100 MG PO TABS: 100.0000 mg | ORAL_TABLET | Freq: Every day | ORAL | Status: AC

## 2012-06-08 NOTE — Telephone Encounter (Signed)
Pt informed

## 2012-06-08 NOTE — Telephone Encounter (Signed)
Noted Hold Seroquel Take Lorazepam 1 tab bid Start Thiamine 1 tab a day (it was called in) RTC in a few days. Call if not better Thx

## 2012-06-08 NOTE — Telephone Encounter (Signed)
Pt states he is having confusion this morning after taking the Seroquel last night.

## 2012-06-15 ENCOUNTER — Encounter: Payer: Self-pay | Admitting: Internal Medicine

## 2012-06-15 ENCOUNTER — Ambulatory Visit (INDEPENDENT_AMBULATORY_CARE_PROVIDER_SITE_OTHER): Payer: Medicare Other | Admitting: Internal Medicine

## 2012-06-15 VITALS — BP 130/80 | HR 68 | Temp 98.3°F | Resp 16 | Wt 189.0 lb

## 2012-06-15 DIAGNOSIS — F4310 Post-traumatic stress disorder, unspecified: Secondary | ICD-10-CM

## 2012-06-15 DIAGNOSIS — Z7289 Other problems related to lifestyle: Secondary | ICD-10-CM | POA: Diagnosis not present

## 2012-06-15 DIAGNOSIS — F22 Delusional disorders: Secondary | ICD-10-CM

## 2012-06-15 MED ORDER — ESCITALOPRAM OXALATE 5 MG PO TABS: 5.0000 mg | ORAL_TABLET | Freq: Every day | ORAL | Status: DC

## 2012-06-15 NOTE — Assessment & Plan Note (Addendum)
Start Lexapro 5 mg/d In councelling Psych appt is scheduled

## 2012-06-15 NOTE — Assessment & Plan Note (Signed)
Resolved

## 2012-06-15 NOTE — Assessment & Plan Note (Signed)
Not drinking now Thiamin x 1 mo

## 2012-06-15 NOTE — Progress Notes (Signed)
Patient ID: Jacob Figueroa, male   DOB: 07-28-1936, 76 y.o.   MRN: [id number]  Subjective:    Patient ID: Jacob Figueroa, male    DOB: 04-03-1936, 76 y.o.   MRN: [id number]  HPI F/u on  PTSD  Doing much beter. He could not take seroquel No HAs, gait issues, dizziness    Review of Systems  Constitutional: Negative for appetite change, fatigue and unexpected weight change.  HENT: Negative for nosebleeds, congestion, sore throat, sneezing, trouble swallowing and neck pain.   Eyes: Negative for itching and visual disturbance.  Respiratory: Negative for cough.   Cardiovascular: Negative for chest pain, palpitations and leg swelling.  Gastrointestinal: Negative for nausea, diarrhea, blood in stool and abdominal distention.  Genitourinary: Negative for frequency and hematuria.  Musculoskeletal: Negative for back pain, joint swelling and gait problem.  Skin: Negative for rash.  Neurological: Negative for dizziness, tremors, seizures, syncope, speech difficulty, weakness, light-headedness, numbness and headaches.  Hematological: Does not bruise/bleed easily.  Psychiatric/Behavioral: Negative for suicidal ideas, hallucinations, behavioral problems, confusion, disturbed wake/sleep cycle, self-injury, dysphoric mood, decreased concentration and agitation. The patient is not nervous/anxious and is not hyperactive.        Objective:   Physical Exam  Constitutional: He is oriented to person, place, and time. He appears well-developed.  HENT:  Mouth/Throat: Oropharynx is clear and moist.  Eyes: Conjunctivae are normal. Pupils are equal, round, and reactive to light.  Neck: Normal range of motion. No JVD present. No thyromegaly present.  Cardiovascular: Normal rate, regular rhythm, normal heart sounds and intact distal pulses.  Exam reveals no gallop and no friction rub.   No murmur heard. Pulmonary/Chest: Effort normal and breath sounds normal. No respiratory distress. He has no wheezes. He has no  rales. He exhibits no tenderness.  Abdominal: Soft. Bowel sounds are normal. He exhibits no distension and no mass. There is no tenderness. There is no rebound and no guarding.  Musculoskeletal: Normal range of motion. He exhibits no edema and no tenderness.  Lymphadenopathy:    He has no cervical adenopathy.  Neurological: He is alert and oriented to person, place, and time. He has normal reflexes. No cranial nerve deficit. He exhibits normal muscle tone. Coordination normal.       no hand tremor  Skin: Skin is warm and dry. No rash noted.  Psychiatric: His behavior is normal. Judgment and thought content normal.       Looks better Not suicidal or homicidal          Assessment & Plan:

## 2012-09-07 DIAGNOSIS — Z23 Encounter for immunization: Secondary | ICD-10-CM | POA: Diagnosis not present

## 2012-09-11 DIAGNOSIS — L219 Seborrheic dermatitis, unspecified: Secondary | ICD-10-CM | POA: Diagnosis not present

## 2012-09-11 DIAGNOSIS — D239 Other benign neoplasm of skin, unspecified: Secondary | ICD-10-CM | POA: Diagnosis not present

## 2012-09-11 DIAGNOSIS — L570 Actinic keratosis: Secondary | ICD-10-CM | POA: Diagnosis not present

## 2013-04-30 DIAGNOSIS — Q619 Cystic kidney disease, unspecified: Secondary | ICD-10-CM | POA: Diagnosis not present

## 2013-04-30 DIAGNOSIS — N529 Male erectile dysfunction, unspecified: Secondary | ICD-10-CM | POA: Diagnosis not present

## 2013-04-30 DIAGNOSIS — N400 Enlarged prostate without lower urinary tract symptoms: Secondary | ICD-10-CM | POA: Diagnosis not present

## 2013-09-25 DIAGNOSIS — Z23 Encounter for immunization: Secondary | ICD-10-CM | POA: Diagnosis not present

## 2013-12-20 ENCOUNTER — Encounter: Payer: Self-pay | Admitting: Internal Medicine

## 2013-12-20 ENCOUNTER — Other Ambulatory Visit (INDEPENDENT_AMBULATORY_CARE_PROVIDER_SITE_OTHER): Payer: Medicare Other

## 2013-12-20 ENCOUNTER — Ambulatory Visit (INDEPENDENT_AMBULATORY_CARE_PROVIDER_SITE_OTHER): Payer: Medicare Other | Admitting: Internal Medicine

## 2013-12-20 VITALS — BP 148/80 | HR 58 | Temp 98.3°F | Ht 70.0 in | Wt 198.0 lb

## 2013-12-20 DIAGNOSIS — F4310 Post-traumatic stress disorder, unspecified: Secondary | ICD-10-CM | POA: Diagnosis not present

## 2013-12-20 DIAGNOSIS — R209 Unspecified disturbances of skin sensation: Secondary | ICD-10-CM

## 2013-12-20 DIAGNOSIS — N400 Enlarged prostate without lower urinary tract symptoms: Secondary | ICD-10-CM

## 2013-12-20 DIAGNOSIS — Z0000 Encounter for general adult medical examination without abnormal findings: Secondary | ICD-10-CM

## 2013-12-20 DIAGNOSIS — N320 Bladder-neck obstruction: Secondary | ICD-10-CM

## 2013-12-20 DIAGNOSIS — F22 Delusional disorders: Secondary | ICD-10-CM

## 2013-12-20 DIAGNOSIS — R413 Other amnesia: Secondary | ICD-10-CM

## 2013-12-20 DIAGNOSIS — E785 Hyperlipidemia, unspecified: Secondary | ICD-10-CM

## 2013-12-20 DIAGNOSIS — Z23 Encounter for immunization: Secondary | ICD-10-CM | POA: Diagnosis not present

## 2013-12-20 DIAGNOSIS — R202 Paresthesia of skin: Secondary | ICD-10-CM

## 2013-12-20 LAB — VITAMIN B12: Vitamin B-12: 388 pg/mL (ref 211–911)

## 2013-12-20 LAB — CBC WITH DIFFERENTIAL/PLATELET
BASOS ABS: 0 10*3/uL (ref 0.0–0.1)
Basophils Relative: 0.1 % (ref 0.0–3.0)
Eosinophils Absolute: 0 10*3/uL (ref 0.0–0.7)
Eosinophils Relative: 0.7 % (ref 0.0–5.0)
HEMATOCRIT: 44.4 % (ref 39.0–52.0)
HEMOGLOBIN: 14.6 g/dL (ref 13.0–17.0)
LYMPHS ABS: 0.7 10*3/uL (ref 0.7–4.0)
Lymphocytes Relative: 12.4 % (ref 12.0–46.0)
MCHC: 32.8 g/dL (ref 30.0–36.0)
MCV: 95.6 fl (ref 78.0–100.0)
MONOS PCT: 6.9 % (ref 3.0–12.0)
Monocytes Absolute: 0.4 10*3/uL (ref 0.1–1.0)
NEUTROS ABS: 4.4 10*3/uL (ref 1.4–7.7)
Neutrophils Relative %: 79.9 % — ABNORMAL HIGH (ref 43.0–77.0)
Platelets: 222 10*3/uL (ref 150.0–400.0)
RBC: 4.64 Mil/uL (ref 4.22–5.81)
RDW: 14.2 % (ref 11.5–14.6)
WBC: 5.5 10*3/uL (ref 4.5–10.5)

## 2013-12-20 LAB — HEPATIC FUNCTION PANEL
ALBUMIN: 4.2 g/dL (ref 3.5–5.2)
ALT: 18 U/L (ref 0–53)
AST: 25 U/L (ref 0–37)
Alkaline Phosphatase: 60 U/L (ref 39–117)
Bilirubin, Direct: 0.1 mg/dL (ref 0.0–0.3)
TOTAL PROTEIN: 7.7 g/dL (ref 6.0–8.3)
Total Bilirubin: 0.9 mg/dL (ref 0.3–1.2)

## 2013-12-20 LAB — BASIC METABOLIC PANEL
BUN: 15 mg/dL (ref 6–23)
CO2: 27 meq/L (ref 19–32)
Calcium: 9.4 mg/dL (ref 8.4–10.5)
Chloride: 104 mEq/L (ref 96–112)
Creatinine, Ser: 0.9 mg/dL (ref 0.4–1.5)
GFR: 86.26 mL/min (ref >60.00)
GLUCOSE: 104 mg/dL — AB (ref 70–99)
POTASSIUM: 4.4 meq/L (ref 3.5–5.1)
SODIUM: 140 meq/L (ref 135–145)

## 2013-12-20 LAB — URINALYSIS
Bilirubin Urine: NEGATIVE
HGB URINE DIPSTICK: NEGATIVE
KETONES UR: NEGATIVE
Leukocytes, UA: NEGATIVE
Nitrite: NEGATIVE
Specific Gravity, Urine: 1.02 (ref 1.000–1.030)
Total Protein, Urine: NEGATIVE
URINE GLUCOSE: NEGATIVE
Urobilinogen, UA: 0.2 (ref 0.0–1.0)
pH: 6 (ref 5.0–8.0)

## 2013-12-20 LAB — TSH: TSH: 2.9 u[IU]/mL (ref 0.35–5.50)

## 2013-12-20 MED ORDER — TAMSULOSIN HCL 0.4 MG PO CAPS: 0.4000 mg | ORAL_CAPSULE | Freq: Every day | ORAL | Status: DC

## 2013-12-20 MED ORDER — PHOSPHATIDYLSERINE-DHA-EPA 100-19.5-6.5 MG PO CAPS: 1.0000 | ORAL_CAPSULE | Freq: Every day | ORAL | Status: DC

## 2013-12-20 MED ORDER — CHLORDIAZEPOXIDE HCL 25 MG PO CAPS: 25.0000 mg | ORAL_CAPSULE | Freq: Two times a day (BID) | ORAL | Status: DC | PRN

## 2013-12-20 NOTE — Assessment & Plan Note (Signed)
  Chronic  Declined statins

## 2013-12-20 NOTE — Progress Notes (Signed)
  Subjective:    HPI  The patient is here for a wellness exam. C/o marital stress, weakness in arms and legs - more tremor than weaknes, fatigue, memory issues C/o weak legs  F/u on  PTSD No HAs, gait issues, dizziness    Review of Systems  Constitutional: Negative for appetite change, fatigue and unexpected weight change.  HENT: Negative for congestion, nosebleeds, sneezing, sore throat and trouble swallowing.   Eyes: Negative for itching and visual disturbance.  Respiratory: Negative for cough.   Cardiovascular: Negative for chest pain, palpitations and leg swelling.  Gastrointestinal: Negative for nausea, diarrhea, blood in stool and abdominal distention.  Genitourinary: Negative for frequency and hematuria.  Musculoskeletal: Negative for back pain, gait problem, joint swelling and neck pain.  Skin: Negative for rash.  Neurological: Negative for dizziness, tremors, seizures, syncope, speech difficulty, weakness, light-headedness, numbness and headaches.  Hematological: Does not bruise/bleed easily.  Psychiatric/Behavioral: Negative for suicidal ideas, hallucinations, behavioral problems, confusion, sleep disturbance, self-injury, dysphoric mood, decreased concentration and agitation. The patient is nervous/anxious. The patient is not hyperactive.        Objective:   Physical Exam  Constitutional: He is oriented to person, place, and time. He appears well-developed.  HENT:  Mouth/Throat: Oropharynx is clear and moist.  Eyes: Conjunctivae are normal. Pupils are equal, round, and reactive to light.  Neck: Normal range of motion. No JVD present. No thyromegaly present.  Cardiovascular: Normal rate, regular rhythm, normal heart sounds and intact distal pulses.  Exam reveals no gallop and no friction rub.   No murmur heard. Pulmonary/Chest: Effort normal and breath sounds normal. No respiratory distress. He has no wheezes. He has no rales. He exhibits no tenderness.  Abdominal:  Soft. Bowel sounds are normal. He exhibits no distension and no mass. There is no tenderness. There is no rebound and no guarding.  Musculoskeletal: Normal range of motion. He exhibits no edema and no tenderness.  Lymphadenopathy:    He has no cervical adenopathy.  Neurological: He is alert and oriented to person, place, and time. He has normal reflexes. No cranial nerve deficit. He exhibits normal muscle tone. Coordination normal.  no hand tremor  Skin: Skin is warm and dry. No rash noted.  Psychiatric: His behavior is normal. Judgment and thought content normal.  Looks better Not suicidal or homicidal  Serial 7s - all errors Recollection of 3 phrases is 3/3        Assessment & Plan:

## 2013-12-20 NOTE — Assessment & Plan Note (Addendum)
Continue with current prescription therapy as reflected on the Med list. Declined counseling

## 2013-12-20 NOTE — Assessment & Plan Note (Signed)
Continue with current prescription therapy as reflected on the Med list. Labs  

## 2013-12-20 NOTE — Assessment & Plan Note (Signed)
Needs to stop

## 2013-12-20 NOTE — Assessment & Plan Note (Signed)

## 2013-12-20 NOTE — Progress Notes (Signed)
Pre-visit discussion using our clinic review tool. No additional management support is needed unless otherwise documented below in the visit note.  

## 2013-12-22 NOTE — Assessment & Plan Note (Signed)
States he is doing well

## 2013-12-22 NOTE — Assessment & Plan Note (Signed)
Vayacog Rx Will monitor

## 2013-12-26 ENCOUNTER — Telehealth: Payer: Self-pay | Admitting: *Deleted

## 2013-12-26 MED ORDER — PHOSPHATIDYLSERINE-DHA-EPA 100-19.5-6.5 MG PO CAPS: 1.0000 | ORAL_CAPSULE | Freq: Every day | ORAL | Status: DC

## 2013-12-26 MED ORDER — TAMSULOSIN HCL 0.4 MG PO CAPS: 0.4000 mg | ORAL_CAPSULE | Freq: Every day | ORAL | Status: DC

## 2013-12-26 MED ORDER — CHLORDIAZEPOXIDE HCL 25 MG PO CAPS: 25.0000 mg | ORAL_CAPSULE | Freq: Two times a day (BID) | ORAL | Status: DC | PRN

## 2013-12-26 NOTE — Telephone Encounter (Signed)
OK Pls change Thx 

## 2013-12-26 NOTE — Telephone Encounter (Signed)
Scripts printed, awaiting MD sig (librium to be faxed) & spouse notified.

## 2013-12-26 NOTE — Telephone Encounter (Signed)
Spouse phoned stating that previously ordered meds on 12/20/13 had been sent to wrong pharmacy and needed to be sent to [facility] on [facility], preferably for 90 day supply.  Please advise.  CB# [phone]

## 2014-01-07 ENCOUNTER — Other Ambulatory Visit (INDEPENDENT_AMBULATORY_CARE_PROVIDER_SITE_OTHER): Payer: Medicare Other

## 2014-01-07 DIAGNOSIS — N320 Bladder-neck obstruction: Secondary | ICD-10-CM

## 2014-01-07 DIAGNOSIS — F4310 Post-traumatic stress disorder, unspecified: Secondary | ICD-10-CM

## 2014-01-07 DIAGNOSIS — Z0000 Encounter for general adult medical examination without abnormal findings: Secondary | ICD-10-CM | POA: Diagnosis not present

## 2014-01-07 DIAGNOSIS — R413 Other amnesia: Secondary | ICD-10-CM

## 2014-01-07 DIAGNOSIS — E785 Hyperlipidemia, unspecified: Secondary | ICD-10-CM | POA: Diagnosis not present

## 2014-01-07 DIAGNOSIS — N400 Enlarged prostate without lower urinary tract symptoms: Secondary | ICD-10-CM | POA: Diagnosis not present

## 2014-01-07 DIAGNOSIS — R209 Unspecified disturbances of skin sensation: Secondary | ICD-10-CM

## 2014-01-07 DIAGNOSIS — R202 Paresthesia of skin: Secondary | ICD-10-CM

## 2014-01-07 LAB — LIPID PANEL
Cholesterol: 235 mg/dL — ABNORMAL HIGH (ref 0–200)
HDL: 50.9 mg/dL (ref >39.00)
LDL Cholesterol: 139 mg/dL — ABNORMAL HIGH (ref 0–99)
Total CHOL/HDL Ratio: 5
Triglycerides: 226 mg/dL — ABNORMAL HIGH (ref 0.0–149.0)
VLDL: 45.2 mg/dL — ABNORMAL HIGH (ref 0.0–40.0)

## 2014-01-07 LAB — PSA: PSA: 1.38 ng/mL (ref 0.10–4.00)

## 2014-03-21 ENCOUNTER — Ambulatory Visit (INDEPENDENT_AMBULATORY_CARE_PROVIDER_SITE_OTHER): Payer: Medicare Other | Admitting: Internal Medicine

## 2014-03-21 ENCOUNTER — Encounter: Payer: Self-pay | Admitting: Internal Medicine

## 2014-03-21 VITALS — BP 122/80 | HR 49 | Temp 97.0°F | Ht 70.0 in | Wt 191.2 lb

## 2014-03-21 DIAGNOSIS — F4310 Post-traumatic stress disorder, unspecified: Secondary | ICD-10-CM

## 2014-03-21 DIAGNOSIS — F22 Delusional disorders: Secondary | ICD-10-CM

## 2014-03-21 MED ORDER — SILDENAFIL CITRATE 100 MG PO TABS: 100.0000 mg | ORAL_TABLET | ORAL | Status: DC | PRN

## 2014-03-21 NOTE — Progress Notes (Signed)
Pre visit review using our clinic review tool, if applicable. No additional management support is needed unless otherwise documented below in the visit note. 

## 2014-03-24 NOTE — Assessment & Plan Note (Signed)
Doing better.   

## 2014-03-24 NOTE — Assessment & Plan Note (Signed)
Doing good  

## 2014-03-24 NOTE — Progress Notes (Signed)
   Subjective:    HPI   F/u marital stress, weakness in arms and legs - more tremor than weaknes, fatigue, memory issues   F/u on  PTSD No HAs, gait issues, dizziness  Wt Readings from Last 3 Encounters:  03/21/14 191 lb 4 oz (86.75 kg)  12/20/13 198 lb (89.812 kg)  06/15/12 189 lb (85.73 kg)   BP Readings from Last 3 Encounters:  03/21/14 122/80  12/20/13 148/80  06/15/12 130/80      Review of Systems  Constitutional: Negative for appetite change, fatigue and unexpected weight change.  HENT: Negative for congestion, nosebleeds, sneezing, sore throat and trouble swallowing.   Eyes: Negative for itching and visual disturbance.  Respiratory: Negative for cough.   Cardiovascular: Negative for chest pain, palpitations and leg swelling.  Gastrointestinal: Negative for nausea, diarrhea, blood in stool and abdominal distention.  Genitourinary: Negative for frequency and hematuria.  Musculoskeletal: Negative for back pain, gait problem, joint swelling and neck pain.  Skin: Negative for rash.  Neurological: Negative for dizziness, tremors, seizures, syncope, speech difficulty, weakness, light-headedness, numbness and headaches.  Hematological: Does not bruise/bleed easily.  Psychiatric/Behavioral: Negative for suicidal ideas, hallucinations, behavioral problems, confusion, sleep disturbance, self-injury, dysphoric mood, decreased concentration and agitation. The patient is not nervous/anxious and is not hyperactive.        Objective:   Physical Exam  Constitutional: He is oriented to person, place, and time. He appears well-developed.  HENT:  Mouth/Throat: Oropharynx is clear and moist.  Eyes: Conjunctivae are normal. Pupils are equal, round, and reactive to light.  Neck: Normal range of motion. No JVD present. No thyromegaly present.  Cardiovascular: Normal rate, regular rhythm, normal heart sounds and intact distal pulses.  Exam reveals no gallop and no friction rub.   No  murmur heard. Pulmonary/Chest: Effort normal and breath sounds normal. No respiratory distress. He has no wheezes. He has no rales. He exhibits no tenderness.  Abdominal: Soft. Bowel sounds are normal. He exhibits no distension and no mass. There is no tenderness. There is no rebound and no guarding.  Musculoskeletal: Normal range of motion. He exhibits no edema and no tenderness.  Lymphadenopathy:    He has no cervical adenopathy.  Neurological: He is alert and oriented to person, place, and time. He has normal reflexes. No cranial nerve deficit. He exhibits normal muscle tone. Coordination normal.  no hand tremor  Skin: Skin is warm and dry. No rash noted.  Psychiatric: His behavior is normal. Judgment and thought content normal.  Looks better Not suicidal or homicidal    Lab Results  Component Value Date   WBC 5.5 12/20/2013   HGB 14.6 12/20/2013   HCT 44.4 12/20/2013   PLT 222.0 12/20/2013   GLUCOSE 104* 12/20/2013   CHOL 235* 01/07/2014   TRIG 226.0* 01/07/2014   HDL 50.90 01/07/2014   LDLDIRECT 118.8 10/11/2011   LDLCALC 139* 01/07/2014   ALT 18 12/20/2013   AST 25 12/20/2013   NA 140 12/20/2013   K 4.4 12/20/2013   CL 104 12/20/2013   CREATININE 0.9 12/20/2013   BUN 15 12/20/2013   CO2 27 12/20/2013   TSH 2.90 12/20/2013   PSA 1.38 01/07/2014         Assessment & Plan:

## 2014-06-25 ENCOUNTER — Other Ambulatory Visit: Payer: Self-pay | Admitting: *Deleted

## 2014-06-25 MED ORDER — ALLOPURINOL 100 MG PO TABS: 200.0000 mg | ORAL_TABLET | Freq: Every day | ORAL | Status: DC

## 2014-08-05 ENCOUNTER — Other Ambulatory Visit: Payer: Self-pay | Admitting: Internal Medicine

## 2014-08-05 NOTE — Telephone Encounter (Signed)
Pls advise on refills.../lmb 

## 2014-08-05 NOTE — Telephone Encounter (Signed)
Will send in refill but only for 1 month supply, please call in. Librium 25 mg BID prn anxiety #60 no refills.   Dr. Doug Sou

## 2014-08-05 NOTE — Telephone Encounter (Signed)
Called pharmacy spoke with [name] gave md authorization on the librium...Jacob Figueroa

## 2014-09-17 DIAGNOSIS — Z23 Encounter for immunization: Secondary | ICD-10-CM | POA: Diagnosis not present

## 2014-11-07 ENCOUNTER — Encounter (HOSPITAL_BASED_OUTPATIENT_CLINIC_OR_DEPARTMENT_OTHER): Payer: Self-pay | Admitting: *Deleted

## 2014-11-07 ENCOUNTER — Emergency Department (HOSPITAL_BASED_OUTPATIENT_CLINIC_OR_DEPARTMENT_OTHER): Payer: Medicare Other

## 2014-11-07 ENCOUNTER — Emergency Department (HOSPITAL_BASED_OUTPATIENT_CLINIC_OR_DEPARTMENT_OTHER)
Admission: EM | Admit: 2014-11-07 | Discharge: 2014-11-07 | Disposition: A | Discharge status: Home / Self Care | Payer: Medicare Other | Attending: Emergency Medicine | Admitting: Emergency Medicine

## 2014-11-07 DIAGNOSIS — Z0389 Encounter for observation for other suspected diseases and conditions ruled out: Secondary | ICD-10-CM | POA: Diagnosis not present

## 2014-11-07 DIAGNOSIS — Y9289 Other specified places as the place of occurrence of the external cause: Secondary | ICD-10-CM | POA: Insufficient documentation

## 2014-11-07 DIAGNOSIS — S0990XA Unspecified injury of head, initial encounter: Secondary | ICD-10-CM | POA: Diagnosis not present

## 2014-11-07 DIAGNOSIS — Z79899 Other long term (current) drug therapy: Secondary | ICD-10-CM | POA: Insufficient documentation

## 2014-11-07 DIAGNOSIS — Y9389 Activity, other specified: Secondary | ICD-10-CM | POA: Diagnosis not present

## 2014-11-07 DIAGNOSIS — M109 Gout, unspecified: Secondary | ICD-10-CM | POA: Insufficient documentation

## 2014-11-07 DIAGNOSIS — N400 Enlarged prostate without lower urinary tract symptoms: Secondary | ICD-10-CM | POA: Diagnosis not present

## 2014-11-07 DIAGNOSIS — H8110 Benign paroxysmal vertigo, unspecified ear: Secondary | ICD-10-CM | POA: Insufficient documentation

## 2014-11-07 DIAGNOSIS — R42 Dizziness and giddiness: Secondary | ICD-10-CM | POA: Diagnosis not present

## 2014-11-07 DIAGNOSIS — Z043 Encounter for examination and observation following other accident: Secondary | ICD-10-CM | POA: Diagnosis not present

## 2014-11-07 DIAGNOSIS — W1830XA Fall on same level, unspecified, initial encounter: Secondary | ICD-10-CM | POA: Insufficient documentation

## 2014-11-07 DIAGNOSIS — Z87442 Personal history of urinary calculi: Secondary | ICD-10-CM | POA: Diagnosis not present

## 2014-11-07 DIAGNOSIS — Y998 Other external cause status: Secondary | ICD-10-CM | POA: Insufficient documentation

## 2014-11-07 DIAGNOSIS — Z87891 Personal history of nicotine dependence: Secondary | ICD-10-CM | POA: Diagnosis not present

## 2014-11-07 DIAGNOSIS — Z8659 Personal history of other mental and behavioral disorders: Secondary | ICD-10-CM | POA: Diagnosis not present

## 2014-11-07 LAB — CBC
HEMATOCRIT: 42.5 % (ref 39.0–52.0)
HEMOGLOBIN: 14.5 g/dL (ref 13.0–17.0)
MCH: 32.2 pg (ref 26.0–34.0)
MCHC: 34.1 g/dL (ref 30.0–36.0)
MCV: 94.4 fL (ref 78.0–100.0)
Platelets: 209 10*3/uL (ref 150–400)
RBC: 4.5 MIL/uL (ref 4.22–5.81)
RDW: 13.2 % (ref 11.5–15.5)
WBC: 7.4 10*3/uL (ref 4.0–10.5)

## 2014-11-07 LAB — BASIC METABOLIC PANEL
Anion gap: 8 (ref 5–15)
BUN: 18 mg/dL (ref 6–23)
CO2: 27 mmol/L (ref 19–32)
CREATININE: 0.9 mg/dL (ref 0.50–1.35)
Calcium: 9.4 mg/dL (ref 8.4–10.5)
Chloride: 107 mEq/L (ref 96–112)
GFR calc non Af Amer: 81 mL/min — ABNORMAL LOW (ref >90)
Glucose, Bld: 97 mg/dL (ref 70–99)
POTASSIUM: 4.2 mmol/L (ref 3.5–5.1)
Sodium: 142 mmol/L (ref 135–145)

## 2014-11-07 LAB — URINALYSIS, ROUTINE W REFLEX MICROSCOPIC
BILIRUBIN URINE: NEGATIVE
GLUCOSE, UA: NEGATIVE mg/dL
HGB URINE DIPSTICK: NEGATIVE
Ketones, ur: NEGATIVE mg/dL
Leukocytes, UA: NEGATIVE
Nitrite: NEGATIVE
Protein, ur: NEGATIVE mg/dL
SPECIFIC GRAVITY, URINE: 1.022 (ref 1.005–1.030)
UROBILINOGEN UA: 0.2 mg/dL (ref 0.0–1.0)
pH: 5.5 (ref 5.0–8.0)

## 2014-11-07 MED ORDER — MECLIZINE HCL 12.5 MG PO TABS: 25.0000 mg | ORAL_TABLET | Freq: Three times a day (TID) | ORAL | Status: DC | PRN

## 2014-11-07 MED ORDER — MECLIZINE HCL 25 MG PO TABS
25.0000 mg | ORAL_TABLET | Freq: Once | ORAL | Status: AC
  Administered 2014-11-07: 25 mg via ORAL
  Filled 2014-11-07: qty 1

## 2014-11-07 NOTE — ED Notes (Signed)
Woke this am with dizziness. States he lost his balance and fell 2 times this am. Denies. Pain.

## 2014-11-07 NOTE — Discharge Instructions (Signed)
Return to the ED with any concerns including weakness of arms or legs, changes in vision or speech, fainting, vomiting and not able to keep down liquids, decreased level of alertness/lethargy, or any other alarming symptoms °

## 2014-11-07 NOTE — ED Provider Notes (Signed)
CSN: [id number]     Arrival date & time 11/07/14  1048 History   First MD Initiated Contact with Patient 11/07/14 1248     Chief Complaint  Patient presents with  . Fall  . Dizziness     (Consider location/radiation/quality/duration/timing/severity/associated sxs/prior Treatment) HPI  Pt presents after 2 episodes of falling this morning.  He states he fell due to sensation of spinning/vertigo.  Pt states he awoke with symptoms present.  No changes in vision or speech.  No focal weakness.  Pt denies striking his head when he fell.  No neck or back pain.  Denies any current pain.  Denies LOC.  No chest pain or palpitations.  No nausea or vomiting. Symptoms are continuous.  There are no other associated systemic symptoms, there are no other alleviating or modifying factors.   Past Medical History  Diagnosis Date  . Gout   . Kidney stones     Dr. Jules Schick  . BPH (benign prostatic hypertrophy)   . PTSD (post-traumatic stress disorder)    History reviewed. No pertinent past surgical history. Family History  Problem Relation Age of Onset  . Cancer Other     Colon cancer  . Alzheimer's disease Other   . Depression Sister     bipolar   History  Substance Use Topics  . Smoking status: Former Research scientist (life sciences)  . Smokeless tobacco: Current User    Types: Chew  . Alcohol Use: Yes     Comment: 2-3 drinks/night    Review of Systems  ROS reviewed and all otherwise negative except for mentioned in HPI    Allergies  Lovastatin and Seroquel  Home Medications   Prior to Admission medications   Medication Sig Start Date End Date Taking? Authorizing Provider  allopurinol (ZYLOPRIM) 100 MG tablet Take 2 tablets (200 mg total) by mouth daily. 06/25/14 06/25/15  Aleksei Plotnikov V, MD  chlordiazePOXIDE (LIBRIUM) 25 MG capsule TAKE 1 CAPSULE BY MOUTH TWICE DAILY AS NEEDED FOR ANXIETY 08/05/14   Olga Millers, MD  meclizine (ANTIVERT) 12.5 MG tablet Take 2 tablets (25 mg total) by mouth 3  (three) times daily as needed for dizziness. 11/07/14   Threasa Beards, MD  Multiple Vitamin (MULTIVITAMIN) tablet Take 1 tablet by mouth daily.      Historical Provider, MD  Omega-3 Fatty Acids (FISH OIL) 1000 MG CAPS Take 1 capsule by mouth daily.    Historical Provider, MD  Phosphatidylserine-DHA-EPA (VAYACOG) 100-19.5-6.5 MG CAPS Take 1 capsule by mouth daily. 12/26/13   Aleksei Plotnikov V, MD  sildenafil (VIAGRA) 100 MG tablet Take 1 tablet (100 mg total) by mouth as needed for erectile dysfunction. 03/21/14   Aleksei Plotnikov V, MD  tamsulosin (FLOMAX) 0.4 MG CAPS capsule Take 1 capsule (0.4 mg total) by mouth daily. 12/26/13   Aleksei Plotnikov V, MD   BP 141/78 mmHg  Pulse 64  Temp(Src) 98 F (36.7 C) (Oral)  Resp 18  Ht 5\' 9"  (1.753 m)  Wt 180 lb (81.647 kg)  BMI 26.57 kg/m2  SpO2 100%  Vitals reviewed Physical Exam  Physical Examination: General appearance - alert, well appearing, and in no distress Mental status - alert, oriented to person, place, and time Eyes - pupils equal and reactive, extraocular eye movements intact Mouth - mucous membranes moist, pharynx normal without lesions Neck - supple, no significant adenopathy, no midline tenderness to palpation Chest - clear to auscultation, no wheezes, rales or rhonchi, symmetric air entry Heart - normal rate, regular rhythm, normal  S1, S2, no murmurs, rubs, clicks or gallops Abdomen - soft, nontender, nondistended, no masses or organomegaly Back exam - no midline tenderness to palpation, no CVA tenderness Neurological - alert, oriented x 3, cranial nerves 2-12 tested and intact, strength 5/5 in extremities x 4, sensation intact, FTN testing normal, Dix- [place]-Pike very positive with reproduction of symptoms of vertigo Extremities - peripheral pulses normal, no pedal edema, no clubbing or cyanosis Skin - normal coloration and turgor, no rashes  ED Course  Procedures (including critical care time)  2:50 PM- pt is feeling  much improved after meclizine.  Labs Review Labs Reviewed  BASIC METABOLIC PANEL - Abnormal; Notable for the following:    GFR calc non Af Amer 81 (*)    All other components within normal limits  CBC  URINALYSIS, ROUTINE W REFLEX MICROSCOPIC    Imaging Review Ct Head Wo Contrast  11/07/2014   CLINICAL DATA:  Dizziness, fall.  EXAM: CT HEAD WITHOUT CONTRAST  TECHNIQUE: Contiguous axial images were obtained from the base of the skull through the vertex without intravenous contrast.  COMPARISON:  None.  FINDINGS: No evidence of an acute infarct, acute hemorrhage, mass lesion, mass effect or hydrocephalus. Focal rounded low attenuation in the left basal ganglia is indicative of a remote infarct. Atrophy. Confluent low-attenuation in the periventricular deep white matter is noted. Visualized portions of the paranasal sinuses and mastoid air cells are clear.  IMPRESSION: 1. No acute intracranial abnormality. 2. Atrophy and chronic microvascular white matter ischemic changes. 3. Remote left basal ganglia infarct.   Electronically Signed   By: Lorin Picket M.D.   On: 11/07/2014 13:59     Date/Time:  Thursday November 07 2014 11:02:24 EST Ventricular Rate:  57 PR Interval:  152 QRS Duration: 88 QT Interval:  438 QTC Calculation: 426 R Axis:   73 Text Interpretation:   Poor data quality, interpretation may be  adversely affected Sinus bradycardia Otherwise normal ECG No old tracing  to compare Confirmed by [facility]  MD, Noelene Gang [phone]) on 11/07/2014 11:22:01  AM MDM   Final diagnoses:  Vertigo  benign positional periperal vertigo fall  Pt presenting with diziness/vertigo that caused him to fall x 2.  Pt without findings c/w significant trauma due to fall- no complaints.  dix-hall-pike positive for reproduction of symptoms- making BPPV most likely.  Doubt acute CVA.  Head CT reassuring as well.  After meclizine pt has had complete resolution of his symptoms. Neurologic exam normal.    Discharged with strict return precautions.  Pt agreeable with plan.   Threasa Beards, MD 11/08/14 [phone]

## 2014-11-20 DIAGNOSIS — L290 Pruritus ani: Secondary | ICD-10-CM | POA: Diagnosis not present

## 2014-11-20 DIAGNOSIS — R151 Fecal smearing: Secondary | ICD-10-CM | POA: Diagnosis not present

## 2014-11-20 DIAGNOSIS — K642 Third degree hemorrhoids: Secondary | ICD-10-CM | POA: Diagnosis not present

## 2014-11-20 DIAGNOSIS — K625 Hemorrhage of anus and rectum: Secondary | ICD-10-CM | POA: Diagnosis not present

## 2014-11-27 ENCOUNTER — Ambulatory Visit (INDEPENDENT_AMBULATORY_CARE_PROVIDER_SITE_OTHER): Payer: Medicare Other | Admitting: Internal Medicine

## 2014-11-27 ENCOUNTER — Encounter: Payer: Self-pay | Admitting: Internal Medicine

## 2014-11-27 VITALS — BP 150/92 | HR 82 | Temp 98.7°F | Wt 193.0 lb

## 2014-11-27 DIAGNOSIS — I639 Cerebral infarction, unspecified: Secondary | ICD-10-CM

## 2014-11-27 DIAGNOSIS — I6381 Other cerebral infarction due to occlusion or stenosis of small artery: Secondary | ICD-10-CM

## 2014-11-27 DIAGNOSIS — H8110 Benign paroxysmal vertigo, unspecified ear: Secondary | ICD-10-CM | POA: Diagnosis not present

## 2014-11-27 MED ORDER — VITAMIN D 1000 UNITS PO TABS: 1000.0000 [IU] | ORAL_TABLET | Freq: Every day | ORAL | Status: AC

## 2014-11-27 MED ORDER — ASPIRIN EC 81 MG PO TBEC: 162.0000 mg | DELAYED_RELEASE_TABLET | Freq: Every day | ORAL | Status: AC

## 2014-11-27 NOTE — Assessment & Plan Note (Signed)
11/07/14 CT remote ASA 162 mg/d

## 2014-11-27 NOTE — Patient Instructions (Signed)
Start Meclizine if needed. Start Laruth Bouchard - Daroff exercise several times a day as directed if needed

## 2014-11-27 NOTE — Progress Notes (Signed)
Subjective:    HPI  F/u 11/07/14 ER visit:   "benign positional periperal vertigo fall  Pt presenting with diziness/vertigo that caused him to fall x 2. Pt without findings c/w significant trauma due to fall- no complaints. dix-hall-pike positive for reproduction of symptoms- making BPPV most likely. Doubt acute CVA. Head CT reassuring as well. After meclizine pt has had complete resolution of his symptoms. Neurologic exam normal. Discharged with strict return precautions. Pt agreeable with plan." F/u marital stress, weakness in arms and legs - more tremor than weaknes, fatigue, memory issues No sx's since 11/08/14  F/u on  PTSD No HAs, gait issues, dizziness  Wt Readings from Last 3 Encounters:  11/27/14 193 lb (87.544 kg)  11/07/14 180 lb (81.647 kg)  03/21/14 191 lb 4 oz (86.75 kg)   BP Readings from Last 3 Encounters:  11/27/14 150/92  11/07/14 141/78  03/21/14 122/80      Review of Systems  Constitutional: Negative for appetite change, fatigue and unexpected weight change.  HENT: Negative for congestion, nosebleeds, sneezing, sore throat and trouble swallowing.   Eyes: Negative for itching and visual disturbance.  Respiratory: Negative for cough.   Cardiovascular: Negative for chest pain, palpitations and leg swelling.  Gastrointestinal: Negative for nausea, diarrhea, blood in stool and abdominal distention.  Genitourinary: Negative for frequency and hematuria.  Musculoskeletal: Negative for back pain, joint swelling, gait problem and neck pain.  Skin: Negative for rash.  Neurological: Negative for dizziness, tremors, seizures, syncope, speech difficulty, weakness, light-headedness, numbness and headaches.  Hematological: Does not bruise/bleed easily.  Psychiatric/Behavioral: Negative for suicidal ideas, hallucinations, behavioral problems, confusion, sleep disturbance, self-injury, dysphoric mood, decreased concentration and agitation. The patient is not  nervous/anxious and is not hyperactive.        Objective:   Physical Exam  Constitutional: He is oriented to person, place, and time. He appears well-developed.  HENT:  Mouth/Throat: Oropharynx is clear and moist.  Eyes: Conjunctivae are normal. Pupils are equal, round, and reactive to light.  Neck: Normal range of motion. No JVD present. No thyromegaly present.  Cardiovascular: Normal rate, regular rhythm, normal heart sounds and intact distal pulses.  Exam reveals no gallop and no friction rub.   No murmur heard. Pulmonary/Chest: Effort normal and breath sounds normal. No respiratory distress. He has no wheezes. He has no rales. He exhibits no tenderness.  Abdominal: Soft. Bowel sounds are normal. He exhibits no distension and no mass. There is no tenderness. There is no rebound and no guarding.  Musculoskeletal: Normal range of motion. He exhibits no edema or tenderness.  Lymphadenopathy:    He has no cervical adenopathy.  Neurological: He is alert and oriented to person, place, and time. He has normal reflexes. No cranial nerve deficit. He exhibits normal muscle tone. Coordination normal.  no hand tremor  Skin: Skin is warm and dry. No rash noted.  Psychiatric: His behavior is normal. Judgment and thought content normal.  Looks better Not suicidal or homicidal  H-P (-) B  Lab Results  Component Value Date   WBC 7.4 11/07/2014   HGB 14.5 11/07/2014   HCT 42.5 11/07/2014   PLT 209 11/07/2014   GLUCOSE 97 11/07/2014   CHOL 235* 01/07/2014   TRIG 226.0* 01/07/2014   HDL 50.90 01/07/2014   LDLDIRECT 118.8 10/11/2011   LDLCALC 139* 01/07/2014   ALT 18 12/20/2013   AST 25 12/20/2013   NA 142 11/07/2014   K 4.2 11/07/2014   CL 107 11/07/2014  CREATININE 0.90 11/07/2014   BUN 18 11/07/2014   CO2 27 11/07/2014   TSH 2.90 12/20/2013   PSA 1.38 01/07/2014    CT     Assessment & Plan:

## 2014-11-27 NOTE — Assessment & Plan Note (Signed)
Resolved Benign Positional Vertigo symptoms  Start Meclizine if needed. Start Laruth Bouchard - Daroff exercise several times a day as directed if needed

## 2014-12-04 DIAGNOSIS — K641 Second degree hemorrhoids: Secondary | ICD-10-CM | POA: Diagnosis not present

## 2014-12-18 DIAGNOSIS — K641 Second degree hemorrhoids: Secondary | ICD-10-CM | POA: Diagnosis not present

## 2015-02-03 ENCOUNTER — Other Ambulatory Visit: Payer: Self-pay | Admitting: *Deleted

## 2015-02-03 MED ORDER — PHOSPHATIDYLSERINE-DHA-EPA 100-19.5-6.5 MG PO CAPS: 1.0000 | ORAL_CAPSULE | Freq: Every day | ORAL | Status: DC

## 2015-03-24 ENCOUNTER — Encounter: Payer: Self-pay | Admitting: Internal Medicine

## 2015-03-25 MED ORDER — ALLOPURINOL 100 MG PO TABS
200.0000 mg | ORAL_TABLET | Freq: Every day | ORAL | Status: DC
Start: 2015-03-25 — End: 2015-05-30

## 2015-03-25 MED ORDER — TAMSULOSIN HCL 0.4 MG PO CAPS: 0.4000 mg | ORAL_CAPSULE | Freq: Every day | ORAL | Status: DC

## 2015-05-30 ENCOUNTER — Encounter: Payer: Self-pay | Admitting: Internal Medicine

## 2015-05-30 ENCOUNTER — Other Ambulatory Visit (INDEPENDENT_AMBULATORY_CARE_PROVIDER_SITE_OTHER): Payer: Medicare Other

## 2015-05-30 ENCOUNTER — Ambulatory Visit (INDEPENDENT_AMBULATORY_CARE_PROVIDER_SITE_OTHER): Payer: Medicare Other | Admitting: Internal Medicine

## 2015-05-30 VITALS — BP 140/88 | HR 55 | Temp 97.7°F | Ht 69.0 in | Wt 184.0 lb

## 2015-05-30 DIAGNOSIS — I639 Cerebral infarction, unspecified: Secondary | ICD-10-CM

## 2015-05-30 DIAGNOSIS — R001 Bradycardia, unspecified: Secondary | ICD-10-CM

## 2015-05-30 DIAGNOSIS — R002 Palpitations: Secondary | ICD-10-CM

## 2015-05-30 LAB — BASIC METABOLIC PANEL
BUN: 15 mg/dL (ref 6–23)
CO2: 29 mEq/L (ref 19–32)
CREATININE: 0.96 mg/dL (ref 0.40–1.50)
Calcium: 9.6 mg/dL (ref 8.4–10.5)
Chloride: 104 mEq/L (ref 96–112)
GFR: 80.78 mL/min (ref >60.00)
Glucose, Bld: 97 mg/dL (ref 70–99)
POTASSIUM: 4.7 meq/L (ref 3.5–5.1)
SODIUM: 139 meq/L (ref 135–145)

## 2015-05-30 LAB — CBC WITH DIFFERENTIAL/PLATELET
Basophils Absolute: 0 10*3/uL (ref 0.0–0.1)
Basophils Relative: 0.4 % (ref 0.0–3.0)
Eosinophils Absolute: 0.1 10*3/uL (ref 0.0–0.7)
Eosinophils Relative: 1.1 % (ref 0.0–5.0)
HEMATOCRIT: 44.3 % (ref 39.0–52.0)
Hemoglobin: 14.8 g/dL (ref 13.0–17.0)
LYMPHS ABS: 0.8 10*3/uL (ref 0.7–4.0)
Lymphocytes Relative: 12.5 % (ref 12.0–46.0)
MCHC: 33.5 g/dL (ref 30.0–36.0)
MCV: 94.7 fl (ref 78.0–100.0)
MONO ABS: 0.6 10*3/uL (ref 0.1–1.0)
Monocytes Relative: 8.2 % (ref 3.0–12.0)
NEUTROS ABS: 5.2 10*3/uL (ref 1.4–7.7)
Neutrophils Relative %: 77.8 % — ABNORMAL HIGH (ref 43.0–77.0)
Platelets: 235 10*3/uL (ref 150.0–400.0)
RBC: 4.68 Mil/uL (ref 4.22–5.81)
RDW: 14.1 % (ref 11.5–15.5)
WBC: 6.7 10*3/uL (ref 4.0–10.5)

## 2015-05-30 LAB — HEPATIC FUNCTION PANEL
ALT: 13 U/L (ref 0–53)
AST: 21 U/L (ref 0–37)
Albumin: 4.2 g/dL (ref 3.5–5.2)
Alkaline Phosphatase: 67 U/L (ref 39–117)
BILIRUBIN TOTAL: 0.6 mg/dL (ref 0.2–1.2)
Bilirubin, Direct: 0.1 mg/dL (ref 0.0–0.3)
Total Protein: 7 g/dL (ref 6.0–8.3)

## 2015-05-30 LAB — MAGNESIUM: Magnesium: 2.2 mg/dL (ref 1.5–2.5)

## 2015-05-30 LAB — TSH: TSH: 1.9 u[IU]/mL (ref 0.35–4.50)

## 2015-05-30 MED ORDER — CHLORDIAZEPOXIDE HCL 25 MG PO CAPS: 25.0000 mg | ORAL_CAPSULE | Freq: Three times a day (TID) | ORAL | Status: DC | PRN

## 2015-05-30 MED ORDER — ALLOPURINOL 100 MG PO TABS: 200.0000 mg | ORAL_TABLET | Freq: Every day | ORAL | Status: DC

## 2015-05-30 MED ORDER — SILODOSIN 4 MG PO CAPS: 4.0000 mg | ORAL_CAPSULE | Freq: Every day | ORAL | Status: DC

## 2015-05-30 MED ORDER — SILDENAFIL CITRATE 100 MG PO TABS: 100.0000 mg | ORAL_TABLET | ORAL | Status: DC | PRN

## 2015-05-30 NOTE — Assessment & Plan Note (Signed)
Use decaf coffee Limit alcohol to 1-2 drinks a day Avoid overheating Use water:gatorade - 3:1 when sweating a lot  Labs EKG Cardiol ref

## 2015-05-30 NOTE — Patient Instructions (Addendum)
Use decaf coffee Limit alcohol to 1-2 drinks a day Avoid overheating Use water:gatorade - 3:1 when sweating a lot Go to ER if worse

## 2015-05-30 NOTE — Progress Notes (Signed)
Subjective:  Patient ID: Jacob Figueroa, male    DOB: 1936/01/25  Age: 79 y.o. MRN: [id number]  CC: No chief complaint on file.   HPI Jacob Figueroa presents for a flutter complaint off and on for several weeks 2-3 sec then would come back; many times a day on some days. No CP, dizziness w/it. No syncope. 3 cups of coffee q am; 3-5 drinks a day. He has been sweating a lot working outside  Outpatient Prescriptions Prior to Visit  Medication Sig Dispense Refill  . aspirin EC 81 MG tablet Take 2 tablets (162 mg total) by mouth daily. 100 tablet 3  . cholecalciferol (VITAMIN D) 1000 UNITS tablet Take 1 tablet (1,000 Units total) by mouth daily. 100 tablet 3  . meclizine (ANTIVERT) 12.5 MG tablet Take 2 tablets (25 mg total) by mouth 3 (three) times daily as needed for dizziness. 30 tablet 0  . Multiple Vitamin (MULTIVITAMIN) tablet Take 1 tablet by mouth daily.      . Omega-3 Fatty Acids (FISH OIL) 1000 MG CAPS Take 1 capsule by mouth daily.    . Phosphatidylserine-DHA-EPA (VAYACOG) 100-19.5-6.5 MG CAPS Take 1 capsule by mouth daily. 90 capsule 3  . allopurinol (ZYLOPRIM) 100 MG tablet Take 2 tablets (200 mg total) by mouth daily. 180 tablet 0  . chlordiazePOXIDE (LIBRIUM) 25 MG capsule TAKE 1 CAPSULE BY MOUTH TWICE DAILY AS NEEDED FOR ANXIETY 60 capsule 0  . sildenafil (VIAGRA) 100 MG tablet Take 1 tablet (100 mg total) by mouth as needed for erectile dysfunction. 12 tablet 11  . tamsulosin (FLOMAX) 0.4 MG CAPS capsule Take 1 capsule (0.4 mg total) by mouth daily. 90 capsule 0   No facility-administered medications prior to visit.    ROS Review of Systems  Constitutional: Negative for appetite change, fatigue and unexpected weight change.  HENT: Negative for congestion, nosebleeds, sneezing, sore throat and trouble swallowing.   Eyes: Negative for itching and visual disturbance.  Respiratory: Negative for cough.   Cardiovascular: Positive for palpitations. Negative for chest pain and leg  swelling.  Gastrointestinal: Negative for nausea, diarrhea, blood in stool and abdominal distention.  Genitourinary: Negative for frequency and hematuria.  Musculoskeletal: Negative for back pain, joint swelling, gait problem and neck pain.  Skin: Negative for rash.  Neurological: Negative for dizziness, tremors, speech difficulty and weakness.  Psychiatric/Behavioral: Negative for sleep disturbance, dysphoric mood and agitation. The patient is not nervous/anxious.     Objective:  BP 140/88 mmHg  Pulse 55  Temp(Src) 97.7 F (36.5 C) (Oral)  Ht 5\' 9"  (1.753 m)  Wt 184 lb (83.462 kg)  BMI 27.16 kg/m2  SpO2 97%  BP Readings from Last 3 Encounters:  05/30/15 140/88  11/27/14 150/92  11/07/14 141/78    Wt Readings from Last 3 Encounters:  05/30/15 184 lb (83.462 kg)  11/27/14 193 lb (87.544 kg)  11/07/14 180 lb (81.647 kg)    Physical Exam  Constitutional: He is oriented to person, place, and time. He appears well-developed. No distress.  NAD  HENT:  Mouth/Throat: Oropharynx is clear and moist.  Eyes: Conjunctivae are normal. Pupils are equal, round, and reactive to light.  Neck: Normal range of motion. No JVD present. No thyromegaly present.  Cardiovascular: Normal rate, regular rhythm, normal heart sounds and intact distal pulses.  Exam reveals no gallop and no friction rub.   No murmur heard. Pulmonary/Chest: Effort normal and breath sounds normal. No respiratory distress. He has no wheezes. He has no rales.  He exhibits no tenderness.  Abdominal: Soft. Bowel sounds are normal. He exhibits no distension and no mass. There is no tenderness. There is no rebound and no guarding.  Musculoskeletal: Normal range of motion. He exhibits no edema or tenderness.  Lymphadenopathy:    He has no cervical adenopathy.  Neurological: He is alert and oriented to person, place, and time. He has normal reflexes. No cranial nerve deficit. He exhibits normal muscle tone. He displays a negative  Romberg sign. Coordination and gait normal.  Skin: Skin is warm and dry. No rash noted.  Psychiatric: He has a normal mood and affect. His behavior is normal. Judgment and thought content normal.    Lab Results  Component Value Date   WBC 6.7 05/30/2015   HGB 14.8 05/30/2015   HCT 44.3 05/30/2015   PLT 235.0 05/30/2015   GLUCOSE 97 05/30/2015   CHOL 235* 01/07/2014   TRIG 226.0* 01/07/2014   HDL 50.90 01/07/2014   LDLDIRECT 118.8 10/11/2011   LDLCALC 139* 01/07/2014   ALT 13 05/30/2015   AST 21 05/30/2015   NA 139 05/30/2015   K 4.7 05/30/2015   CL 104 05/30/2015   CREATININE 0.96 05/30/2015   BUN 15 05/30/2015   CO2 29 05/30/2015   TSH 1.90 05/30/2015   PSA 1.38 01/07/2014    Ct Head Wo Contrast  11/07/2014   CLINICAL DATA:  Dizziness, fall.  EXAM: CT HEAD WITHOUT CONTRAST  TECHNIQUE: Contiguous axial images were obtained from the base of the skull through the vertex without intravenous contrast.  COMPARISON:  None.  FINDINGS: No evidence of an acute infarct, acute hemorrhage, mass lesion, mass effect or hydrocephalus. Focal rounded low attenuation in the left basal ganglia is indicative of a remote infarct. Atrophy. Confluent low-attenuation in the periventricular deep white matter is noted. Visualized portions of the paranasal sinuses and mastoid air cells are clear.  IMPRESSION: 1. No acute intracranial abnormality. 2. Atrophy and chronic microvascular white matter ischemic changes. 3. Remote left basal ganglia infarct.   Electronically Signed   By: Lorin Picket M.D.   On: 11/07/2014 13:59    Assessment & Plan:   Diagnoses and all orders for this visit:  Palpitations Orders: -     EKG 12-Lead -     Ambulatory referral to Cardiology -     CBC with Differential/Platelet; Future -     Basic metabolic panel; Future -     Hepatic function panel; Future -     TSH; Future -     Magnesium; Future  Bradycardia Orders: -     Ambulatory referral to Cardiology -     CBC  with Differential/Platelet; Future -     Basic metabolic panel; Future -     Hepatic function panel; Future -     TSH; Future -     Magnesium; Future  Basal ganglia infarction  Other orders -     silodosin (RAPAFLO) 4 MG CAPS capsule; Take 1 capsule (4 mg total) by mouth daily with breakfast. -     chlordiazePOXIDE (LIBRIUM) 25 MG capsule; Take 1 capsule (25 mg total) by mouth 3 (three) times daily as needed for anxiety. -     sildenafil (VIAGRA) 100 MG tablet; Take 1 tablet (100 mg total) by mouth as needed for erectile dysfunction. -     allopurinol (ZYLOPRIM) 100 MG tablet; Take 2 tablets (200 mg total) by mouth daily.  I have discontinued Mr. Ciolek's tamsulosin. I have also changed his chlordiazePOXIDE.  Additionally, I am having him start on silodosin. Lastly, I am having him maintain his multivitamin, Fish Oil, meclizine, aspirin EC, cholecalciferol, Phosphatidylserine-DHA-EPA, sildenafil, and allopurinol.  Meds ordered this encounter  Medications  . silodosin (RAPAFLO) 4 MG CAPS capsule    Sig: Take 1 capsule (4 mg total) by mouth daily with breakfast.    Dispense:  30 capsule    Refill:  5  . chlordiazePOXIDE (LIBRIUM) 25 MG capsule    Sig: Take 1 capsule (25 mg total) by mouth 3 (three) times daily as needed for anxiety.    Dispense:  60 capsule    Refill:  2    This request is for a new prescription for a controlled substance as required by Federal/State law..  . sildenafil (VIAGRA) 100 MG tablet    Sig: Take 1 tablet (100 mg total) by mouth as needed for erectile dysfunction.    Dispense:  12 tablet    Refill:  11  . allopurinol (ZYLOPRIM) 100 MG tablet    Sig: Take 2 tablets (200 mg total) by mouth daily.    Dispense:  180 tablet    Refill:  3    Yearly physical is due must see md for future refills     Follow-up: Return in about 4 weeks (around 06/27/2015).  Walker Kehr, MD   EKG S brady

## 2015-05-30 NOTE — Progress Notes (Signed)
Pre visit review using our clinic review tool, if applicable. No additional management support is needed unless otherwise documented below in the visit note. 

## 2015-05-30 NOTE — Assessment & Plan Note (Signed)
D/c Flomax. Start Rapaflo Card ref

## 2015-06-01 NOTE — Assessment & Plan Note (Signed)
11/07/14 CT remote Needs good BP control Avoid extreme physical activity

## 2015-06-02 ENCOUNTER — Other Ambulatory Visit: Payer: Self-pay | Admitting: Internal Medicine

## 2015-06-02 ENCOUNTER — Telehealth: Payer: Self-pay | Admitting: Internal Medicine

## 2015-06-02 MED ORDER — TAMSULOSIN HCL 0.4 MG PO CAPS: 0.4000 mg | ORAL_CAPSULE | Freq: Every day | ORAL | Status: DC

## 2015-06-02 NOTE — Telephone Encounter (Signed)
Patient was prescribed a new medication (he didn't remember the name) to replace tamsulosin (FLOMAX) 0.4 MG CAPS capsule.  The medication costs $80 more a month and he would really like to stay with the tamsulosin. He's been taking that for years with no problem Pharmacy is [facility]

## 2015-06-02 NOTE — Progress Notes (Signed)
Cardiology Office Note   Date:  06/03/2015   ID:  Jacob Figueroa, DOB 1936/08/13, MRN [id number]  PCP:  Jacob Kehr, MD  Cardiologist:  New - Dr. Darlin Figueroa     Chief Complaint  Patient presents with  . Palpitations  . Bradycardia     History of Present Illness: Jacob Figueroa is a 79 y.o. male with a hx of HL (intol of statins), gout, nephrolithiasis.  He is an ex-smoker.  He previously would drink > 2 beers a day.  He is a retired Engineer, structural.  He was recently seen by his PCP for palpitations and was referred for evaluation.  Labs 7/22 - normal K+, Mg2+, Hgb, TSH, Cr.  Here today with his wife.  He describes "flutters" for 2 weeks.  They increased in frequency and then stopped.  Since he saw his PCP, he has decreased his ETOH to 1-2 beers a night.  He had no symptoms with his palpitations.  Denies dizziness or dyspnea.  He denies decreased exercise tolerance.  Denies significant dyspnea.  He is NYHA 2.  Denies chest pain.  No orthopnea, PND, edema.  He does drink 2 cups of coffee per day. No stimulant use.  No drugs.   Studies/Reports Reviewed Today:  Carotid US 11/2009 Bilateral ICA 0-39%   Past Medical History  Diagnosis Date  . Gout   . Kidney stones     Dr. Jules Figueroa  . BPH (benign prostatic hypertrophy)   . PTSD (post-traumatic stress disorder)   . HLD (hyperlipidemia)     statin intolerant  . History of stroke     Noted on CT in past    Past Surgical History  Procedure Laterality Date  . Tonsillectomy       Current Outpatient Prescriptions  Medication Sig Dispense Refill  . allopurinol (ZYLOPRIM) 100 MG tablet Take 2 tablets (200 mg total) by mouth daily. 180 tablet 3  . aspirin EC 81 MG tablet Take 2 tablets (162 mg total) by mouth daily. 100 tablet 3  . chlordiazePOXIDE (LIBRIUM) 25 MG capsule Take 1 capsule (25 mg total) by mouth 3 (three) times daily as needed for anxiety. 60 capsule 2  . cholecalciferol (VITAMIN D) 1000 UNITS tablet Take  1 tablet (1,000 Units total) by mouth daily. 100 tablet 3  . Multiple Vitamin (MULTIVITAMIN) tablet Take 1 tablet by mouth daily.      . Omega-3 Fatty Acids (FISH OIL) 1000 MG CAPS Take 1 capsule by mouth daily.    . Phosphatidylserine-DHA-EPA (VAYACOG) 100-19.5-6.5 MG CAPS Take 1 capsule by mouth daily. 90 capsule 3  . sildenafil (VIAGRA) 100 MG tablet Take 1 tablet (100 mg total) by mouth as needed for erectile dysfunction. 12 tablet 11  . tamsulosin (FLOMAX) 0.4 MG CAPS capsule Take 1 capsule (0.4 mg total) by mouth daily. 90 capsule 3  . tamsulosin (FLOMAX) 0.4 MG CAPS capsule TAKE 1 CAPSULE BY MOUTH ONCE A DAY 90 capsule 3   No current facility-administered medications for this visit.    Allergies:   Lovastatin and Seroquel    Social History:  The patient  reports that he has quit smoking. His smokeless tobacco use includes Snuff. He reports that he drinks alcohol. He reports that he does not use illicit drugs.   Family History:  The patient's family history includes Alzheimer's disease in his father; Cancer in his mother; Colon cancer in his father; Depression in his sister; Heart failure in his father.    ROS:  Please see the history of present illness.   Review of Systems  HENT: Positive for hearing loss.   Cardiovascular: Positive for irregular heartbeat.  Psychiatric/Behavioral: The patient is nervous/anxious.       PHYSICAL EXAM: VS:  BP 140/70 mmHg  Pulse 68  Ht 5\' 10"  (1.778 m)  Wt 183 lb (83.008 kg)  BMI 26.26 kg/m2    Wt Readings from Last 3 Encounters:  06/03/15 183 lb (83.008 kg)  05/30/15 184 lb (83.462 kg)  11/27/14 193 lb (87.544 kg)     GEN: Well nourished, well developed, in no acute distress HEENT: normal Neck: no JVD, no carotid bruits, no masses Cardiac:  Normal S1/S2, RRR; no murmur ,  no rubs or gallops, no edema ; DP/PT 2+ bilaterally Respiratory:  clear to auscultation bilaterally, no wheezing, rhonchi or rales. GI: soft, nontender,  nondistended, + BS MS: no deformity or atrophy Skin: warm and dry  Neuro:  CNs II-XII intact, Strength and sensation are intact Psych: Normal affect   EKG:  EKG is not ordered today.  ECG from 05/30/15 demonstrates:   Sinus bradycardia, HR 48, normal axis, no ST changes, QTc 443 ms   Recent Labs: 05/30/2015: ALT 13; BUN 15; Creatinine, Ser 0.96; Hemoglobin 14.8; Magnesium 2.2; Platelets 235.0; Potassium 4.7; Sodium 139; TSH 1.90    Lipid Panel    Component Value Date/Time   CHOL 235* 01/07/2014 1135   TRIG 226.0* 01/07/2014 1135   HDL 50.90 01/07/2014 1135   CHOLHDL 5 01/07/2014 1135   VLDL 45.2* 01/07/2014 1135   LDLCALC 139* 01/07/2014 1135   LDLDIRECT 118.8 10/11/2011 1052      ASSESSMENT AND PLAN:  Palpitations - His symptoms sound consistent with PVC/PACs.  TSH, Mg2+, K+ ok.  Plan: Holter monitor - 48 hour to rule out any significant arrhythmias.  Bradycardia - He does not appear to be symptomatic.  Plan: Holter monitor - 48 hour  HLD (hyperlipidemia):  Statin intolerant.  Alcohol use:  He has cut back.      Medication Changes: Current medicines are reviewed at length with the patient today.  Concerns regarding medicines are as outlined above.  The following changes have been made:   Discontinued Medications   MECLIZINE (ANTIVERT) 12.5 MG TABLET    Take 2 tablets (25 mg total) by mouth 3 (three) times daily as needed for dizziness.   Modified Medications   No medications on file   New Prescriptions   TAMSULOSIN (FLOMAX) 0.4 MG CAPS CAPSULE    TAKE 1 CAPSULE BY MOUTH ONCE A DAY     Labs/ tests ordered today include:   Orders Placed This Encounter  Procedures  . Holter monitor - 48 hour     Disposition:   FU with Dr. Darlin Figueroa or me as needed.    Signed, Versie Starks, MHS 06/03/2015 12:39 PM    [facility] [address], [place], [place]  [address] Phone: [phone]; Fax: [phone]

## 2015-06-02 NOTE — Telephone Encounter (Signed)
Ok Done Thx 

## 2015-06-03 ENCOUNTER — Encounter: Payer: Self-pay | Admitting: Physician Assistant

## 2015-06-03 ENCOUNTER — Ambulatory Visit (INDEPENDENT_AMBULATORY_CARE_PROVIDER_SITE_OTHER): Payer: Medicare Other | Admitting: Physician Assistant

## 2015-06-03 VITALS — BP 140/70 | HR 68 | Ht 70.0 in | Wt 183.0 lb

## 2015-06-03 DIAGNOSIS — R002 Palpitations: Secondary | ICD-10-CM

## 2015-06-03 DIAGNOSIS — F1099 Alcohol use, unspecified with unspecified alcohol-induced disorder: Secondary | ICD-10-CM

## 2015-06-03 DIAGNOSIS — E785 Hyperlipidemia, unspecified: Secondary | ICD-10-CM | POA: Diagnosis not present

## 2015-06-03 DIAGNOSIS — Z789 Other specified health status: Secondary | ICD-10-CM

## 2015-06-03 DIAGNOSIS — R001 Bradycardia, unspecified: Secondary | ICD-10-CM | POA: Diagnosis not present

## 2015-06-03 DIAGNOSIS — Z7289 Other problems related to lifestyle: Secondary | ICD-10-CM

## 2015-06-03 DIAGNOSIS — I639 Cerebral infarction, unspecified: Secondary | ICD-10-CM

## 2015-06-03 NOTE — Telephone Encounter (Signed)
Pt informed

## 2015-06-03 NOTE — Patient Instructions (Signed)
Medication Instructions:  -No change  Labwork:- -None  Testing/Procedures: Your physician has recommended that you wear a 48 hour holter monitor. Holter monitors are medical devices that record the heart's electrical activity. Doctors most often use these monitors to diagnose arrhythmias. Arrhythmias are problems with the speed or rhythm of the heartbeat. The monitor is a small, portable device. You can wear one while you do your normal daily activities. This is usually used to diagnose what is causing palpitations/syncope (passing out).  Will come back on August 2 @ 9:30 am  Follow-Up: As needed  Any Other Special Instructions Will Be Listed Below (If Applicable).

## 2015-06-10 ENCOUNTER — Ambulatory Visit: Payer: Medicare Other

## 2015-06-10 ENCOUNTER — Encounter: Payer: Self-pay | Admitting: *Deleted

## 2015-06-10 NOTE — Progress Notes (Signed)
Patient ID: Jacob Figueroa, male   DOB: 1936-06-18, 79 y.o.   MRN: [id number] Patient declined to have 48 hour holter monitor applied at this time.  He was not aware he would not be able to shower for 48 hours.  Patient was informed if he changes his mind, he will just need to call to schedule an appointment.

## 2015-06-12 DIAGNOSIS — H25813 Combined forms of age-related cataract, bilateral: Secondary | ICD-10-CM | POA: Diagnosis not present

## 2015-06-12 DIAGNOSIS — H524 Presbyopia: Secondary | ICD-10-CM | POA: Diagnosis not present

## 2015-06-12 DIAGNOSIS — H5203 Hypermetropia, bilateral: Secondary | ICD-10-CM | POA: Diagnosis not present

## 2015-06-12 DIAGNOSIS — H52223 Regular astigmatism, bilateral: Secondary | ICD-10-CM | POA: Diagnosis not present

## 2015-08-06 DIAGNOSIS — Z23 Encounter for immunization: Secondary | ICD-10-CM | POA: Diagnosis not present

## 2015-08-14 ENCOUNTER — Encounter: Payer: Self-pay | Admitting: Internal Medicine

## 2015-08-15 ENCOUNTER — Other Ambulatory Visit: Payer: Self-pay | Admitting: Internal Medicine

## 2015-08-15 DIAGNOSIS — H833X3 Noise effects on inner ear, bilateral: Secondary | ICD-10-CM

## 2015-09-17 DIAGNOSIS — H903 Sensorineural hearing loss, bilateral: Secondary | ICD-10-CM | POA: Diagnosis not present

## 2015-09-17 DIAGNOSIS — H9313 Tinnitus, bilateral: Secondary | ICD-10-CM | POA: Diagnosis not present

## 2016-01-17 ENCOUNTER — Other Ambulatory Visit: Payer: Self-pay | Admitting: Internal Medicine

## 2016-02-12 DIAGNOSIS — K642 Third degree hemorrhoids: Secondary | ICD-10-CM | POA: Diagnosis not present

## 2016-02-12 DIAGNOSIS — K5730 Diverticulosis of large intestine without perforation or abscess without bleeding: Secondary | ICD-10-CM | POA: Diagnosis not present

## 2016-02-12 DIAGNOSIS — Z800 Family history of malignant neoplasm of digestive organs: Secondary | ICD-10-CM | POA: Diagnosis not present

## 2016-02-12 DIAGNOSIS — Z86010 Personal history of colonic polyps: Secondary | ICD-10-CM | POA: Diagnosis not present

## 2016-05-14 ENCOUNTER — Other Ambulatory Visit: Payer: Self-pay | Admitting: Internal Medicine

## 2016-05-18 DIAGNOSIS — D225 Melanocytic nevi of trunk: Secondary | ICD-10-CM | POA: Diagnosis not present

## 2016-05-18 DIAGNOSIS — L570 Actinic keratosis: Secondary | ICD-10-CM | POA: Diagnosis not present

## 2016-05-18 DIAGNOSIS — L821 Other seborrheic keratosis: Secondary | ICD-10-CM | POA: Diagnosis not present

## 2016-05-18 DIAGNOSIS — L814 Other melanin hyperpigmentation: Secondary | ICD-10-CM | POA: Diagnosis not present

## 2016-07-14 ENCOUNTER — Encounter: Payer: Self-pay | Admitting: Internal Medicine

## 2016-07-14 ENCOUNTER — Ambulatory Visit (INDEPENDENT_AMBULATORY_CARE_PROVIDER_SITE_OTHER): Payer: Medicare Other | Admitting: Internal Medicine

## 2016-07-14 ENCOUNTER — Other Ambulatory Visit (INDEPENDENT_AMBULATORY_CARE_PROVIDER_SITE_OTHER): Payer: Medicare Other

## 2016-07-14 VITALS — BP 140/80 | HR 59 | Ht 69.0 in | Wt 183.0 lb

## 2016-07-14 DIAGNOSIS — R413 Other amnesia: Secondary | ICD-10-CM | POA: Diagnosis not present

## 2016-07-14 DIAGNOSIS — N400 Enlarged prostate without lower urinary tract symptoms: Secondary | ICD-10-CM

## 2016-07-14 DIAGNOSIS — Z0000 Encounter for general adult medical examination without abnormal findings: Secondary | ICD-10-CM

## 2016-07-14 DIAGNOSIS — M109 Gout, unspecified: Secondary | ICD-10-CM

## 2016-07-14 DIAGNOSIS — Z7289 Other problems related to lifestyle: Secondary | ICD-10-CM

## 2016-07-14 DIAGNOSIS — N320 Bladder-neck obstruction: Secondary | ICD-10-CM

## 2016-07-14 DIAGNOSIS — E785 Hyperlipidemia, unspecified: Secondary | ICD-10-CM

## 2016-07-14 DIAGNOSIS — Z789 Other specified health status: Secondary | ICD-10-CM

## 2016-07-14 LAB — URINALYSIS
BILIRUBIN URINE: NEGATIVE
Hgb urine dipstick: NEGATIVE
KETONES UR: NEGATIVE
Leukocytes, UA: NEGATIVE
Nitrite: NEGATIVE
PH: 6 (ref 5.0–8.0)
Specific Gravity, Urine: 1.01 (ref 1.000–1.030)
TOTAL PROTEIN, URINE-UPE24: NEGATIVE
Urine Glucose: NEGATIVE
Urobilinogen, UA: 0.2 (ref 0.0–1.0)

## 2016-07-14 LAB — LIPID PANEL
CHOL/HDL RATIO: 3
Cholesterol: 221 mg/dL — ABNORMAL HIGH (ref 0–200)
HDL: 63.9 mg/dL (ref >39.00)
LDL CALC: 134 mg/dL — AB (ref 0–99)
NONHDL: 157.37
Triglycerides: 116 mg/dL (ref 0.0–149.0)
VLDL: 23.2 mg/dL (ref 0.0–40.0)

## 2016-07-14 LAB — HEPATIC FUNCTION PANEL
ALK PHOS: 68 U/L (ref 39–117)
ALT: 12 U/L (ref 0–53)
AST: 20 U/L (ref 0–37)
Albumin: 4.3 g/dL (ref 3.5–5.2)
BILIRUBIN TOTAL: 0.4 mg/dL (ref 0.2–1.2)
Bilirubin, Direct: 0.1 mg/dL (ref 0.0–0.3)
Total Protein: 7.2 g/dL (ref 6.0–8.3)

## 2016-07-14 LAB — CBC WITH DIFFERENTIAL/PLATELET
Basophils Absolute: 0 10*3/uL (ref 0.0–0.1)
Basophils Relative: 0.3 % (ref 0.0–3.0)
EOS PCT: 0.9 % (ref 0.0–5.0)
Eosinophils Absolute: 0.1 10*3/uL (ref 0.0–0.7)
HCT: 42.3 % (ref 39.0–52.0)
HEMOGLOBIN: 14.4 g/dL (ref 13.0–17.0)
Lymphocytes Relative: 11.5 % — ABNORMAL LOW (ref 12.0–46.0)
Lymphs Abs: 0.9 10*3/uL (ref 0.7–4.0)
MCHC: 34 g/dL (ref 30.0–36.0)
MCV: 92.7 fl (ref 78.0–100.0)
MONO ABS: 0.7 10*3/uL (ref 0.1–1.0)
Monocytes Relative: 8.5 % (ref 3.0–12.0)
Neutro Abs: 6.3 10*3/uL (ref 1.4–7.7)
Neutrophils Relative %: 78.8 % — ABNORMAL HIGH (ref 43.0–77.0)
Platelets: 244 10*3/uL (ref 150.0–400.0)
RBC: 4.56 Mil/uL (ref 4.22–5.81)
RDW: 14.3 % (ref 11.5–15.5)
WBC: 7.9 10*3/uL (ref 4.0–10.5)

## 2016-07-14 LAB — BASIC METABOLIC PANEL
BUN: 15 mg/dL (ref 6–23)
CALCIUM: 9.3 mg/dL (ref 8.4–10.5)
CO2: 30 mEq/L (ref 19–32)
Chloride: 103 mEq/L (ref 96–112)
Creatinine, Ser: 0.87 mg/dL (ref 0.40–1.50)
GFR: 90.24 mL/min (ref >60.00)
Glucose, Bld: 103 mg/dL — ABNORMAL HIGH (ref 70–99)
Potassium: 4 mEq/L (ref 3.5–5.1)
SODIUM: 139 meq/L (ref 135–145)

## 2016-07-14 LAB — URIC ACID: URIC ACID, SERUM: 5.8 mg/dL (ref 4.0–7.8)

## 2016-07-14 LAB — PSA: PSA: 0.94 ng/mL (ref 0.10–4.00)

## 2016-07-14 LAB — TSH: TSH: 1.96 u[IU]/mL (ref 0.35–4.50)

## 2016-07-14 MED ORDER — ALLOPURINOL 100 MG PO TABS: 100.0000 mg | ORAL_TABLET | Freq: Every day | ORAL | 3 refills | Status: DC

## 2016-07-14 MED ORDER — SILDENAFIL CITRATE 100 MG PO TABS: 100.0000 mg | ORAL_TABLET | ORAL | 5 refills | Status: DC | PRN

## 2016-07-14 MED ORDER — TAMSULOSIN HCL 0.4 MG PO CAPS: 0.4000 mg | ORAL_CAPSULE | Freq: Every day | ORAL | 3 refills | Status: DC

## 2016-07-14 MED ORDER — PHOSPHATIDYLSERINE-DHA-EPA 100-19.5-6.5 MG PO CAPS: 1.0000 | ORAL_CAPSULE | Freq: Every day | ORAL | 3 refills | Status: DC

## 2016-07-14 NOTE — Progress Notes (Signed)
Pre visit review using our clinic review tool, if applicable. No additional management support is needed unless otherwise documented below in the visit note. 

## 2016-07-14 NOTE — Assessment & Plan Note (Addendum)
Worse. Will ref to [facility] 9/17 Reduce beer/wine to <2 a day On Vayacog Other options discussed

## 2016-07-14 NOTE — Assessment & Plan Note (Signed)
On Allopurinol Labs

## 2016-07-14 NOTE — Progress Notes (Signed)
Subjective:  Patient ID: Jacob Figueroa, male    DOB: 1936-06-11  Age: 80 y.o. MRN: [id number]  CC: No chief complaint on file.   HPI Jacob Figueroa presents for a well exam. C/o memory loss - worse. Hearing aids helped. ETOH 4/day - beer or wine  Outpatient Medications Prior to Visit  Medication Sig Dispense Refill  . aspirin EC 81 MG tablet Take 2 tablets (162 mg total) by mouth daily. 100 tablet 3  . chlordiazePOXIDE (LIBRIUM) 25 MG capsule Take 1 capsule (25 mg total) by mouth 3 (three) times daily as needed for anxiety. 60 capsule 2  . Multiple Vitamin (MULTIVITAMIN) tablet Take 1 tablet by mouth daily.      . Omega-3 Fatty Acids (FISH OIL) 1000 MG CAPS Take 1 capsule by mouth daily.    . sildenafil (VIAGRA) 100 MG tablet Take 1 tablet (100 mg total) by mouth as needed for erectile dysfunction. 12 tablet 11  . tamsulosin (FLOMAX) 0.4 MG CAPS capsule Take 1 capsule (0.4 mg total) by mouth daily. 90 capsule 3  . tamsulosin (FLOMAX) 0.4 MG CAPS capsule Take 1 capsule by mouth  once a day 90 capsule 0  . VAYACOG 100-19.5-6.5 MG CAPS TAKE 1 CAPSULE BY MOUTH DAILY. 90 capsule 1   No facility-administered medications prior to visit.     ROS Review of Systems  Constitutional: Negative for appetite change, fatigue and unexpected weight change.  HENT: Negative for congestion, nosebleeds, sneezing, sore throat and trouble swallowing.   Eyes: Negative for itching and visual disturbance.  Respiratory: Negative for cough.   Cardiovascular: Negative for chest pain, palpitations and leg swelling.  Gastrointestinal: Negative for abdominal distention, blood in stool, diarrhea and nausea.  Genitourinary: Negative for frequency and hematuria.  Musculoskeletal: Negative for back pain, gait problem, joint swelling and neck pain.  Skin: Negative for rash.  Neurological: Negative for dizziness, tremors, speech difficulty and weakness.  Psychiatric/Behavioral: Positive for decreased concentration.  Negative for agitation, dysphoric mood, sleep disturbance and suicidal ideas. The patient is not nervous/anxious.     Objective:  BP 140/80   Pulse (!) 59   Ht 5\' 9"  (1.753 m)   Wt 183 lb (83 kg)   SpO2 99%   BMI 27.02 kg/m   BP Readings from Last 3 Encounters:  07/14/16 140/80  06/03/15 140/70  05/30/15 140/88    Wt Readings from Last 3 Encounters:  07/14/16 183 lb (83 kg)  06/03/15 183 lb (83 kg)  05/30/15 184 lb (83.5 kg)    Physical Exam  Constitutional: He is oriented to person, place, and time. He appears well-developed. No distress.  NAD  HENT:  Mouth/Throat: Oropharynx is clear and moist.  Eyes: Conjunctivae are normal. Pupils are equal, round, and reactive to light.  Neck: Normal range of motion. No JVD present. No thyromegaly present.  Cardiovascular: Normal rate, regular rhythm, normal heart sounds and intact distal pulses.  Exam reveals no gallop and no friction rub.   No murmur heard. Pulmonary/Chest: Effort normal and breath sounds normal. No respiratory distress. He has no wheezes. He has no rales. He exhibits no tenderness.  Abdominal: Soft. Bowel sounds are normal. He exhibits no distension and no mass. There is no tenderness. There is no rebound and no guarding.  Musculoskeletal: Normal range of motion. He exhibits no edema or tenderness.  Lymphadenopathy:    He has no cervical adenopathy.  Neurological: He is alert and oriented to person, place, and time. He has normal  reflexes. No cranial nerve deficit. He exhibits normal muscle tone. He displays a negative Romberg sign. Coordination and gait normal.  Skin: Skin is warm and dry. No rash noted.  Psychiatric: He has a normal mood and affect. His behavior is normal. Judgment and thought content normal.    Lab Results  Component Value Date   WBC 6.7 05/30/2015   HGB 14.8 05/30/2015   HCT 44.3 05/30/2015   PLT 235.0 05/30/2015   GLUCOSE 97 05/30/2015   CHOL 235 (H) 01/07/2014   TRIG 226.0 (H)  01/07/2014   HDL 50.90 01/07/2014   LDLDIRECT 118.8 10/11/2011   LDLCALC 139 (H) 01/07/2014   ALT 13 05/30/2015   AST 21 05/30/2015   NA 139 05/30/2015   K 4.7 05/30/2015   CL 104 05/30/2015   CREATININE 0.96 05/30/2015   BUN 15 05/30/2015   CO2 29 05/30/2015   TSH 1.90 05/30/2015   PSA 1.38 01/07/2014    Ct Head Wo Contrast  Result Date: 11/07/2014 CLINICAL DATA:  Dizziness, fall. EXAM: CT HEAD WITHOUT CONTRAST TECHNIQUE: Contiguous axial images were obtained from the base of the skull through the vertex without intravenous contrast. COMPARISON:  None. FINDINGS: No evidence of an acute infarct, acute hemorrhage, mass lesion, mass effect or hydrocephalus. Focal rounded low attenuation in the left basal ganglia is indicative of a remote infarct. Atrophy. Confluent low-attenuation in the periventricular deep white matter is noted. Visualized portions of the paranasal sinuses and mastoid air cells are clear. IMPRESSION: 1. No acute intracranial abnormality. 2. Atrophy and chronic microvascular white matter ischemic changes. 3. Remote left basal ganglia infarct. Electronically Signed   By: Lorin Picket M.D.   On: 11/07/2014 13:59    Assessment & Plan:   There are no diagnoses linked to this encounter. I am having Jacob Figueroa maintain his multivitamin, Fish Oil, aspirin EC, chlordiazePOXIDE, sildenafil, tamsulosin, VAYACOG, tamsulosin, allopurinol, and Red Yeast Rice.  Meds ordered this encounter  Medications  . allopurinol (ZYLOPRIM) 100 MG tablet    Sig: Take 100 mg by mouth daily.  . Red Yeast Rice 600 MG TABS    Sig: Take 1,200 mg by mouth daily.     Follow-up: No Follow-up on file.  Walker Kehr, MD

## 2016-07-14 NOTE — Assessment & Plan Note (Signed)
Flomax Labs

## 2016-07-15 NOTE — Assessment & Plan Note (Signed)
Reduce beer/wine to <2 a day

## 2016-08-05 ENCOUNTER — Encounter: Payer: Self-pay | Admitting: Internal Medicine

## 2016-09-03 ENCOUNTER — Encounter: Payer: Self-pay | Admitting: Internal Medicine

## 2016-10-26 DIAGNOSIS — Z23 Encounter for immunization: Secondary | ICD-10-CM | POA: Diagnosis not present

## 2016-11-16 ENCOUNTER — Encounter: Payer: Self-pay | Admitting: Internal Medicine

## 2016-11-19 ENCOUNTER — Encounter: Payer: Self-pay | Admitting: Internal Medicine

## 2016-11-19 ENCOUNTER — Ambulatory Visit (INDEPENDENT_AMBULATORY_CARE_PROVIDER_SITE_OTHER): Payer: Medicare Other | Admitting: Internal Medicine

## 2016-11-19 DIAGNOSIS — F419 Anxiety disorder, unspecified: Secondary | ICD-10-CM

## 2016-11-19 DIAGNOSIS — E785 Hyperlipidemia, unspecified: Secondary | ICD-10-CM

## 2016-11-19 DIAGNOSIS — I6350 Cerebral infarction due to unspecified occlusion or stenosis of unspecified cerebral artery: Secondary | ICD-10-CM | POA: Diagnosis not present

## 2016-11-19 MED ORDER — GABAPENTIN 100 MG PO CAPS: 100.0000 mg | ORAL_CAPSULE | Freq: Two times a day (BID) | ORAL | 3 refills | Status: AC | PRN

## 2016-11-19 MED ORDER — PRAVASTATIN SODIUM 10 MG PO TABS: 10.0000 mg | ORAL_TABLET | Freq: Every day | ORAL | 3 refills | Status: DC

## 2016-11-19 NOTE — Progress Notes (Signed)
Subjective:  Patient ID: Jacob Figueroa, male    DOB: Nov 16, 1935  Age: 81 y.o. MRN: [id number]  CC: No chief complaint on file.   HPI Jacob Figueroa presents for memory issues, anxiety and startling during the day, BPH f/u  Outpatient Medications Prior to Visit  Medication Sig Dispense Refill  . allopurinol (ZYLOPRIM) 100 MG tablet Take 1 tablet (100 mg total) by mouth daily. 90 tablet 3  . aspirin EC 81 MG tablet Take 2 tablets (162 mg total) by mouth daily. 100 tablet 3  . Multiple Vitamin (MULTIVITAMIN) tablet Take 1 tablet by mouth daily.      . Omega-3 Fatty Acids (FISH OIL) 1000 MG CAPS Take 1 capsule by mouth daily.    . Phosphatidylserine-DHA-EPA (VAYACOG) 100-19.5-6.5 MG CAPS Take 1 capsule by mouth daily. 90 capsule 3  . Red Yeast Rice 600 MG TABS Take 1,200 mg by mouth daily.    . sildenafil (VIAGRA) 100 MG tablet Take 1 tablet (100 mg total) by mouth as needed for erectile dysfunction. 30 tablet 5  . tamsulosin (FLOMAX) 0.4 MG CAPS capsule Take 1 capsule (0.4 mg total) by mouth daily. 90 capsule 3  . allopurinol (ZYLOPRIM) 100 MG tablet Take 2 tablets (200 mg total) by mouth daily. 180 tablet 3  . chlordiazePOXIDE (LIBRIUM) 25 MG capsule Take 1 capsule (25 mg total) by mouth 3 (three) times daily as needed for anxiety. (Patient not taking: Reported on 11/19/2016) 60 capsule 2   No facility-administered medications prior to visit.     ROS Review of Systems  Constitutional: Negative for appetite change, fatigue and unexpected weight change.  HENT: Negative for congestion, nosebleeds, sneezing, sore throat and trouble swallowing.   Eyes: Negative for itching and visual disturbance.  Respiratory: Negative for cough.   Cardiovascular: Negative for chest pain, palpitations and leg swelling.  Gastrointestinal: Negative for abdominal distention, blood in stool, diarrhea and nausea.  Genitourinary: Negative for frequency and hematuria.  Musculoskeletal: Negative for back pain, gait  problem, joint swelling and neck pain.  Skin: Negative for rash.  Neurological: Negative for dizziness, tremors, speech difficulty and weakness.  Psychiatric/Behavioral: Negative for agitation, dysphoric mood and sleep disturbance. The patient is not nervous/anxious.   a/o/c  Objective:  BP 130/80   Pulse (!) 55   Wt 196 lb (88.9 kg)   SpO2 97%   BMI 28.94 kg/m   BP Readings from Last 3 Encounters:  11/19/16 130/80  07/14/16 140/80  06/03/15 140/70    Wt Readings from Last 3 Encounters:  11/19/16 196 lb (88.9 kg)  07/14/16 183 lb (83 kg)  06/03/15 183 lb (83 kg)    Physical Exam  Lab Results  Component Value Date   WBC 7.9 07/14/2016   HGB 14.4 07/14/2016   HCT 42.3 07/14/2016   PLT 244.0 07/14/2016   GLUCOSE 103 (H) 07/14/2016   CHOL 221 (H) 07/14/2016   TRIG 116.0 07/14/2016   HDL 63.90 07/14/2016   LDLDIRECT 118.8 10/11/2011   LDLCALC 134 (H) 07/14/2016   ALT 12 07/14/2016   AST 20 07/14/2016   NA 139 07/14/2016   K 4.0 07/14/2016   CL 103 07/14/2016   CREATININE 0.87 07/14/2016   BUN 15 07/14/2016   CO2 30 07/14/2016   TSH 1.96 07/14/2016   PSA 0.94 07/14/2016    Ct Head Wo Contrast  Result Date: 11/07/2014 CLINICAL DATA:  Dizziness, fall. EXAM: CT HEAD WITHOUT CONTRAST TECHNIQUE: Contiguous axial images were obtained from the base of  the skull through the vertex without intravenous contrast. COMPARISON:  None. FINDINGS: No evidence of an acute infarct, acute hemorrhage, mass lesion, mass effect or hydrocephalus. Focal rounded low attenuation in the left basal ganglia is indicative of a remote infarct. Atrophy. Confluent low-attenuation in the periventricular deep white matter is noted. Visualized portions of the paranasal sinuses and mastoid air cells are clear. IMPRESSION: 1. No acute intracranial abnormality. 2. Atrophy and chronic microvascular white matter ischemic changes. 3. Remote left basal ganglia infarct. Electronically Signed   By: Lorin Picket M.D.   On: 11/07/2014 13:59    Assessment & Plan:   There are no diagnoses linked to this encounter. I have discontinued Mr. Haddix's chlordiazePOXIDE. I am also having him maintain his multivitamin, Fish Oil, aspirin EC, Red Yeast Rice, allopurinol, tamsulosin, sildenafil, and Phosphatidylserine-DHA-EPA.  No orders of the defined types were placed in this encounter.    Follow-up: No Follow-up on file.  Walker Kehr, MD

## 2016-11-19 NOTE — Assessment & Plan Note (Signed)
Try Pravachol

## 2016-11-19 NOTE — Assessment & Plan Note (Signed)
carot doppler

## 2016-11-19 NOTE — Progress Notes (Signed)
Pre visit review using our clinic review tool, if applicable. No additional management support is needed unless otherwise documented below in the visit note. 

## 2016-11-19 NOTE — Patient Instructions (Signed)
CQ10 and Vit D for muscle pain prevention

## 2016-11-21 DIAGNOSIS — F419 Anxiety disorder, unspecified: Secondary | ICD-10-CM | POA: Insufficient documentation

## 2016-11-21 NOTE — Assessment & Plan Note (Signed)
startling - daytime Trial of a low dose Gabapentin

## 2016-11-22 ENCOUNTER — Encounter: Payer: Self-pay | Admitting: Internal Medicine

## 2016-11-23 ENCOUNTER — Encounter: Payer: Self-pay | Admitting: Internal Medicine

## 2016-12-01 ENCOUNTER — Encounter: Payer: Self-pay | Admitting: Internal Medicine

## 2016-12-06 ENCOUNTER — Encounter: Payer: Self-pay | Admitting: Internal Medicine

## 2016-12-17 NOTE — Addendum Note (Signed)
Addended by: Aviva Signs M on: 12/17/2016 12:29 PM   Modules accepted: Orders

## 2016-12-29 ENCOUNTER — Encounter (HOSPITAL_COMMUNITY): Payer: Self-pay

## 2017-01-10 ENCOUNTER — Encounter: Payer: Self-pay | Admitting: Internal Medicine

## 2017-01-10 ENCOUNTER — Ambulatory Visit (HOSPITAL_COMMUNITY)
Admission: RE | Admit: 2017-01-10 | Discharge: 2017-01-10 | Disposition: A | Discharge status: Home / Self Care | Payer: Medicare Other | Source: Ambulatory Visit | Attending: Cardiology | Admitting: Cardiology

## 2017-01-10 DIAGNOSIS — I6350 Cerebral infarction due to unspecified occlusion or stenosis of unspecified cerebral artery: Secondary | ICD-10-CM | POA: Insufficient documentation

## 2017-01-12 ENCOUNTER — Other Ambulatory Visit: Payer: Self-pay | Admitting: Internal Medicine

## 2017-01-12 DIAGNOSIS — H539 Unspecified visual disturbance: Secondary | ICD-10-CM

## 2017-01-13 ENCOUNTER — Telehealth: Payer: Self-pay | Admitting: Internal Medicine

## 2017-01-13 NOTE — Telephone Encounter (Signed)
Jacob Figueroa,  Please inform patient that his carotid arteries [place] was good Thx

## 2017-01-14 NOTE — Telephone Encounter (Signed)
Advised patient of dr plotnikovs note

## 2017-01-19 DIAGNOSIS — H25813 Combined forms of age-related cataract, bilateral: Secondary | ICD-10-CM | POA: Diagnosis not present

## 2017-01-19 DIAGNOSIS — G453 Amaurosis fugax: Secondary | ICD-10-CM | POA: Diagnosis not present

## 2017-01-20 ENCOUNTER — Other Ambulatory Visit: Payer: Self-pay | Admitting: Ophthalmology

## 2017-01-20 DIAGNOSIS — H25813 Combined forms of age-related cataract, bilateral: Secondary | ICD-10-CM

## 2017-01-31 ENCOUNTER — Ambulatory Visit
Admission: RE | Admit: 2017-01-31 | Discharge: 2017-01-31 | Disposition: A | Discharge status: Home / Self Care | Payer: Medicare Other | Source: Ambulatory Visit | Attending: Ophthalmology | Admitting: Ophthalmology

## 2017-01-31 DIAGNOSIS — H5410 Blindness, one eye, low vision other eye, unspecified eyes: Secondary | ICD-10-CM | POA: Diagnosis not present

## 2017-01-31 DIAGNOSIS — H25813 Combined forms of age-related cataract, bilateral: Secondary | ICD-10-CM

## 2017-01-31 MED ORDER — GADOBENATE DIMEGLUMINE 529 MG/ML IV SOLN
16.0000 mL | Freq: Once | INTRAVENOUS | Status: AC | PRN
Start: 2017-01-31 — End: 2017-01-31
  Administered 2017-01-31: 16 mL via INTRAVENOUS

## 2017-02-03 ENCOUNTER — Encounter: Payer: Self-pay | Admitting: Internal Medicine

## 2017-02-28 ENCOUNTER — Ambulatory Visit (INDEPENDENT_AMBULATORY_CARE_PROVIDER_SITE_OTHER): Payer: Medicare Other | Admitting: Neurology

## 2017-02-28 ENCOUNTER — Encounter: Payer: Self-pay | Admitting: Neurology

## 2017-02-28 DIAGNOSIS — G3184 Mild cognitive impairment, so stated: Secondary | ICD-10-CM | POA: Diagnosis not present

## 2017-02-28 DIAGNOSIS — G308 Other Alzheimer's disease: Secondary | ICD-10-CM | POA: Diagnosis not present

## 2017-02-28 MED ORDER — MEMANTINE HCL 10 MG PO TABS: 10.0000 mg | ORAL_TABLET | Freq: Two times a day (BID) | ORAL | 11 refills | Status: DC

## 2017-02-28 MED ORDER — DONEPEZIL HCL 10 MG PO TABS: 10.0000 mg | ORAL_TABLET | Freq: Every day | ORAL | 11 refills | Status: DC

## 2017-02-28 NOTE — Progress Notes (Signed)
PATIENT: Jacob Figueroa DOB: 10-25-1936  Chief Complaint  Patient presents with  . Superficial Siderosis    MMSE - animals.  He is here with his wife, Jacob Figueroa. Reports a declining memory has been a problem for the last few years. They would like to further discuss his abnormal MRI.  Marland Kitchen Optometrist    Jacob Figueroa, OD - referring MD  . PCP    Jacob Anger, MD     HISTORICAL  Jacob Figueroa is a 81 years old right-handed male, accompanied by his wife Jacob Figueroa 31 years, seen in refer by his ophthalmologist Jacob Figueroa and primary care physician Jacob Figueroa for evaluation of memory loss, initial evaluation was on February 28 2017.  He is a retired Engineer, structural at age 62, used to drink alcohol, to the point of drunk, now only drinks 3 or 4 beers every night, had a family history of dementia, father suffered significant memory loss in his 78, died at age 39. 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  He is taking gabapentin 100mg  daily for anxiety.  Since retirement, he kept himself busy by working around house, working on different home project, manage property  Around 2015, he was noted to have word finding difficulties, gradually getting worse, few months ago in 2017, he has to give up his of dictation of managing the household bill,  because he has difficulty staying on task, he gets frustrated easily, began to make mistakes,  He was also noted to withdraw from his friend, he still drives without much difficulty, there was no lateralized motor or sensory deficit  REVIEW OF SYSTEMS: Full 14 system review of systems performed and notable only for Loss of vision, hearing loss, memory loss, confusion, weakness, snoring, disinteresting activities, joint pain  ALLERGIES: Allergies  Allergen Reactions  . Lovastatin     REACTION: myalgias  . Seroquel [Quetiapine Fumarate]     Confusion     HOME MEDICATIONS: Current Outpatient Prescriptions    Medication Sig Dispense Refill  . allopurinol (ZYLOPRIM) 100 MG tablet Take 1 tablet (100 mg total) by mouth daily. 90 tablet 3  . aspirin EC 81 MG tablet Take 2 tablets (162 mg total) by mouth daily. 100 tablet 3  . gabapentin (NEURONTIN) 100 MG capsule Take 1-2 capsules (100-200 mg total) by mouth 2 (two) times daily as needed. 270 capsule 3  . Multiple Vitamin (MULTIVITAMIN) tablet Take 1 tablet by mouth daily.      . Omega-3 Fatty Acids (FISH OIL) 1000 MG CAPS Take 1 capsule by mouth daily.    . pravastatin (PRAVACHOL) 10 MG tablet Take 1 tablet (10 mg total) by mouth daily. 90 tablet 3  . sildenafil (VIAGRA) 100 MG tablet Take 1 tablet (100 mg total) by mouth as needed for erectile dysfunction. 30 tablet 5  . tamsulosin (FLOMAX) 0.4 MG CAPS capsule Take 1 capsule (0.4 mg total) by mouth daily. 90 capsule 3   No current facility-administered medications for this visit.     PAST MEDICAL HISTORY: Past Medical History:  Diagnosis Date  . BPH (benign prostatic hypertrophy)   . Gout   . History of stroke    Noted on CT in past  . HLD (hyperlipidemia)    statin intolerant  . Kidney stones    Jacob Figueroa  . Memory loss   . PTSD (post-traumatic stress disorder)   . Superficial siderosis of central nervous system  PAST SURGICAL HISTORY: Past Surgical History:  Procedure Laterality Date  . KNEE SURGERY Left   . TONSILLECTOMY      FAMILY HISTORY: Family History  Problem Relation Age of Onset  . Depression Sister     bipolar  . Cancer Mother   . Alzheimer's disease Father   . Heart failure Father   . Colon cancer Father     SOCIAL HISTORY:  Social History   Social History  . Marital status: Married    Spouse name: N/A  . Number of children: 1  . Years of education: [employer]   Occupational History  . Retired Engineer, structural Retired   Social History Main Topics  . Smoking status: Former Research scientist (life sciences)  . Smokeless tobacco: Current User    Types: Snuff  .  Alcohol use 0.0 oz/week     Comment: 1-4 drinks/night  . Drug use: No  . Sexual activity: Yes   Other Topics Concern  . Not on file   Social History Narrative   Regular exercise-Yes   Lives at home with his wife.   Right-handed.   2 large cups of coffee daily.     PHYSICAL EXAM   Vitals:   02/28/17 1151  BP: 132/78  Pulse: 61  Weight: 188 lb (85.3 kg)  Height: 5\' 9"  (1.753 m)    Not recorded      Body mass index is 27.76 kg/m.  PHYSICAL EXAMNIATION:  Gen: NAD, conversant, well nourised, obese, well groomed                     Cardiovascular: Regular rate rhythm, no peripheral edema, warm, nontender. Eyes: Conjunctivae clear without exudates or hemorrhage Neck: Supple, no carotid bruits. Pulmonary: Clear to auscultation bilaterally   NEUROLOGICAL EXAM:  MENTAL STATUS: MMSE - Mini Mental State Exam 02/28/2017  Orientation to time 5  Orientation to Place 3  Registration 3  Attention/ Calculation 5  Recall 0  Language- name 2 objects 2  Language- repeat 1  Language- follow 3 step command 3  Language- read & follow direction 1  Write a sentence 1  Copy design 1  Total score 25     CRANIAL NERVES: CN II: Visual fields are full to confrontation. Fundoscopic exam is normal with sharp discs and no vascular changes. Pupils are round equal and briskly reactive to light. CN III, IV, VI: extraocular movement are normal. No ptosis. CN V: Facial sensation is intact to pinprick in all 3 divisions bilaterally. Corneal responses are intact.  CN VII: Face is symmetric with normal eye closure and smile. CN VIII: Hearing is normal to rubbing fingers CN IX, X: Palate elevates symmetrically. Phonation is normal. CN XI: Head turning and shoulder shrug are intact CN XII: Tongue is midline with normal movements and no atrophy.  MOTOR: There is no pronator drift of out-stretched arms. Muscle bulk and tone are normal. Muscle strength is normal.  REFLEXES: Reflexes are 2+  and symmetric at the biceps, triceps, knees, and ankles. Plantar responses are flexor.  SENSORY: Intact to light touch, pinprick, positional sensation and vibratory sensation are intact in fingers and toes.  COORDINATION: Rapid alternating movements and fine finger movements are intact. There is no dysmetria on finger-to-nose and heel-knee-shin.    GAIT/STANCE: Posture is normal. Gait is steady with normal steps, base, arm swing, and turning. Heel and toe walking are normal. Tandem gait is normal.  Romberg is absent.   DIAGNOSTIC DATA (LABS, IMAGING, TESTING) - I  reviewed patient records, labs, notes, testing and imaging myself where available.   ASSESSMENT AND PLAN  LEALAND ELTING is a 81 y.o. male    Mild cognitive impairment  Laboratory evaluations to rule out treatable etiology  Continue to address vascular risk factor  Start Namenda 10 mg twice a day, Aricept 10 mg daily  Marcial Pacas, M.D. Ph.D.  [facility] [address] [place], [place] [address] Ph: [phone] Fax: [phone]  CC: Jacob Mink, MD, Jacob Anger, MD,

## 2017-03-01 LAB — CBC
Hematocrit: 40.6 % (ref 37.5–51.0)
Hemoglobin: 13.7 g/dL (ref 13.0–17.7)
MCH: 30.7 pg (ref 26.6–33.0)
MCHC: 33.7 g/dL (ref 31.5–35.7)
MCV: 91 fL (ref 79–97)
PLATELETS: 250 10*3/uL (ref 150–379)
RBC: 4.46 x10E6/uL (ref 4.14–5.80)
RDW: 14.7 % (ref 12.3–15.4)
WBC: 5.6 10*3/uL (ref 3.4–10.8)

## 2017-03-01 LAB — TSH: TSH: 2.24 u[IU]/mL (ref 0.450–4.500)

## 2017-03-01 LAB — VITAMIN B12: VITAMIN B 12: 544 pg/mL (ref 232–1245)

## 2017-04-08 ENCOUNTER — Encounter: Payer: Self-pay | Admitting: Neurology

## 2017-04-11 ENCOUNTER — Other Ambulatory Visit: Payer: Self-pay | Admitting: *Deleted

## 2017-04-11 MED ORDER — MEMANTINE HCL 10 MG PO TABS
10.0000 mg | ORAL_TABLET | Freq: Two times a day (BID) | ORAL | 3 refills | Status: DC
Start: 2017-04-11 — End: 2017-06-27

## 2017-04-11 MED ORDER — DONEPEZIL HCL 10 MG PO TABS: 10.0000 mg | ORAL_TABLET | Freq: Every day | ORAL | 3 refills | Status: DC

## 2017-04-19 ENCOUNTER — Other Ambulatory Visit (INDEPENDENT_AMBULATORY_CARE_PROVIDER_SITE_OTHER): Payer: Medicare Other

## 2017-04-19 DIAGNOSIS — E785 Hyperlipidemia, unspecified: Secondary | ICD-10-CM | POA: Diagnosis not present

## 2017-04-19 DIAGNOSIS — I6350 Cerebral infarction due to unspecified occlusion or stenosis of unspecified cerebral artery: Secondary | ICD-10-CM | POA: Diagnosis not present

## 2017-04-19 LAB — BASIC METABOLIC PANEL
BUN: 20 mg/dL (ref 6–23)
CALCIUM: 9.4 mg/dL (ref 8.4–10.5)
CO2: 29 mEq/L (ref 19–32)
Chloride: 106 mEq/L (ref 96–112)
Creatinine, Ser: 0.93 mg/dL (ref 0.40–1.50)
GFR: 83.39 mL/min (ref >60.00)
GLUCOSE: 100 mg/dL — AB (ref 70–99)
POTASSIUM: 4 meq/L (ref 3.5–5.1)
Sodium: 140 mEq/L (ref 135–145)

## 2017-04-19 LAB — HEPATIC FUNCTION PANEL
ALK PHOS: 89 U/L (ref 39–117)
ALT: 20 U/L (ref 0–53)
AST: 33 U/L (ref 0–37)
Albumin: 4.2 g/dL (ref 3.5–5.2)
BILIRUBIN DIRECT: 0 mg/dL (ref 0.0–0.3)
BILIRUBIN TOTAL: 0.3 mg/dL (ref 0.2–1.2)
Total Protein: 7.4 g/dL (ref 6.0–8.3)

## 2017-04-19 LAB — LIPID PANEL
CHOL/HDL RATIO: 3
CHOLESTEROL: 166 mg/dL (ref 0–200)
HDL: 57.5 mg/dL (ref >39.00)
LDL CALC: 85 mg/dL (ref 0–99)
NonHDL: 108.77
Triglycerides: 117 mg/dL (ref 0.0–149.0)
VLDL: 23.4 mg/dL (ref 0.0–40.0)

## 2017-05-16 DIAGNOSIS — S83241A Other tear of medial meniscus, current injury, right knee, initial encounter: Secondary | ICD-10-CM | POA: Diagnosis not present

## 2017-06-09 ENCOUNTER — Ambulatory Visit: Payer: Medicare Other | Admitting: Neurology

## 2017-06-13 ENCOUNTER — Ambulatory Visit (INDEPENDENT_AMBULATORY_CARE_PROVIDER_SITE_OTHER): Payer: Medicare Other | Admitting: Neurology

## 2017-06-13 ENCOUNTER — Encounter: Payer: Self-pay | Admitting: Neurology

## 2017-06-13 VITALS — BP 128/75 | HR 62 | Ht 69.0 in | Wt 182.8 lb

## 2017-06-13 DIAGNOSIS — G3184 Mild cognitive impairment, so stated: Secondary | ICD-10-CM | POA: Diagnosis not present

## 2017-06-13 DIAGNOSIS — I6350 Cerebral infarction due to unspecified occlusion or stenosis of unspecified cerebral artery: Secondary | ICD-10-CM

## 2017-06-13 MED ORDER — MEMANTINE HCL-DONEPEZIL HCL ER 28-10 MG PO CP24: 1.0000 | ORAL_CAPSULE | Freq: Every day | ORAL | 11 refills | Status: DC

## 2017-06-13 NOTE — Progress Notes (Signed)
PATIENT: Jacob Figueroa DOB: 1935-11-28  Chief Complaint  Patient presents with  . Follow-up  . [facility]     HISTORICAL  Jacob Figueroa is a 81 years old right-handed male, accompanied by his wife of 20 years, Jacob Figueroa, seen in refer by his ophthalmologist Jacob Figueroa and primary care physician Jacob Figueroa for evaluation of memory loss, initial evaluation was on February 28 2017.  He is a retired Engineer, structural at age 78, used to drink alcohol, to the point of drunk, now only drinks 3 or 4 beers every night, had a family history of dementia, father suffered significant memory loss in his 6, died at age 45. 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  He is taking gabapentin 100mg  daily for anxiety.  Since retirement, he kept himself busy by working around house, working on different home project, manage property  Around 2015, he was noted to have word finding difficulties, gradually getting worse, few months ago in 2017, he has to give up his of dictation of managing the household bill,  because he has difficulty staying on task, he gets frustrated easily, began to make mistakes,  He was also noted to withdraw from his friend, he still drives without much difficulty, there was no lateralized motor or sensory deficit  UPDATE August 6th 2018: He is accompanied by his wife at today's clinical visit, Mini-Mental Status is 18 out of 30, he is now taking Aricept 10 mg every night, Namenda 10 mg twice a day, he complains about morning time dizziness after morning dose of Namenda,  Laboratory evaluation showed normal B12, TSH, CBC, fasting lipid profile, LDL was 85, cholesterol was 166, normal BMP, liver functional tests,  REVIEW OF SYSTEMS: Full 14 system review of systems performed and notable only for hearing loss, joint pain, memory loss, dizziness, snoring  ALLERGIES: Allergies  Allergen Reactions  . Lovastatin     REACTION: myalgias  . Seroquel  [Quetiapine Fumarate]     Confusion     HOME MEDICATIONS: Current Outpatient Prescriptions  Medication Sig Dispense Refill  . allopurinol (ZYLOPRIM) 100 MG tablet Take 1 tablet (100 mg total) by mouth daily. 90 tablet 3  . aspirin EC 81 MG tablet Take 2 tablets (162 mg total) by mouth daily. 100 tablet 3  . donepezil (ARICEPT) 10 MG tablet Take 1 tablet (10 mg total) by mouth at bedtime. 90 tablet 3  . gabapentin (NEURONTIN) 100 MG capsule Take 1-2 capsules (100-200 mg total) by mouth 2 (two) times daily as needed. 270 capsule 3  . memantine (NAMENDA) 10 MG tablet Take 1 tablet (10 mg total) by mouth 2 (two) times daily. 180 tablet 3  . Multiple Vitamin (MULTIVITAMIN) tablet Take 1 tablet by mouth daily.      . pravastatin (PRAVACHOL) 10 MG tablet Take 1 tablet (10 mg total) by mouth daily. 90 tablet 3  . sildenafil (VIAGRA) 100 MG tablet Take 1 tablet (100 mg total) by mouth as needed for erectile dysfunction. 30 tablet 5  . tamsulosin (FLOMAX) 0.4 MG CAPS capsule Take 1 capsule (0.4 mg total) by mouth daily. 90 capsule 3   No current facility-administered medications for this visit.     PAST MEDICAL HISTORY: Past Medical History:  Diagnosis Date  . BPH (benign prostatic hypertrophy)   . Gout   . History of stroke    Noted on CT in past  . HLD (hyperlipidemia)    statin intolerant  .  Kidney stones    Dr. Jules Figueroa  . Memory loss   . PTSD (post-traumatic stress disorder)   . Superficial siderosis of central nervous system     PAST SURGICAL HISTORY: Past Surgical History:  Procedure Laterality Date  . KNEE SURGERY Left   . TONSILLECTOMY      FAMILY HISTORY: Family History  Problem Relation Age of Onset  . Depression Sister        bipolar  . Cancer Mother   . Alzheimer's disease Father   . Heart failure Father   . Colon cancer Father     SOCIAL HISTORY:  Social History   Social History  . Marital status: Married    Spouse name: N/A  . Number of children: 1   . Years of education: [employer]   Occupational History  . Retired Engineer, structural Retired   Social History Main Topics  . Smoking status: Former Research scientist (life sciences)  . Smokeless tobacco: Current User    Types: Snuff  . Alcohol use 0.0 oz/week     Comment: 1-4 drinks/night  . Drug use: No  . Sexual activity: Yes   Other Topics Concern  . Not on file   Social History Narrative   Regular exercise-Yes   Lives at home with his wife.   Right-handed.   2 large cups of coffee daily.     PHYSICAL EXAM   Vitals:   06/13/17 1615  BP: 128/75  Pulse: 62  Weight: 182 lb 12.8 oz (82.9 kg)  Height: 5\' 9"  (1.753 m)    Not recorded      Body mass index is 26.99 kg/m.  PHYSICAL EXAMNIATION:  Gen: NAD, conversant, well nourised, obese, well groomed                     Cardiovascular: Regular rate rhythm, no peripheral edema, warm, nontender. Eyes: Conjunctivae clear without exudates or hemorrhage Neck: Supple, no carotid bruits. Pulmonary: Clear to auscultation bilaterally   NEUROLOGICAL EXAM:  MENTAL STATUS: MMSE - Mini Mental State Exam 06/13/2017 02/28/2017  Orientation to time 3 5  Orientation to Place 1 3  Registration 3 3  Attention/ Calculation 1 5  Recall 1 0  Language- name 2 objects 2 2  Language- repeat 1 1  Language- follow 3 step command 3 3  Language- read & follow direction 1 1  Write a sentence 1 1  Copy design 1 1  Total score 18 25     CRANIAL NERVES: CN II: Visual fields are full to confrontation. Fundoscopic exam is normal with sharp discs and no vascular changes. Pupils are round equal and briskly reactive to light. CN III, IV, VI: extraocular movement are normal. No ptosis. CN V: Facial sensation is intact to pinprick in all 3 divisions bilaterally. Corneal responses are intact.  CN VII: Face is symmetric with normal eye closure and smile. CN VIII: Hearing is normal to rubbing fingers CN IX, X: Palate elevates symmetrically. Phonation is normal. CN  XI: Head turning and shoulder shrug are intact CN XII: Tongue is midline with normal movements and no atrophy.  MOTOR: There is no pronator drift of out-stretched arms. Muscle bulk and tone are normal. Muscle strength is normal.  REFLEXES: Reflexes are 2+ and symmetric at the biceps, triceps, knees, and ankles. Plantar responses are flexor.  SENSORY: Intact to light touch, pinprick, positional sensation and vibratory sensation are intact in fingers and toes.  COORDINATION: Rapid alternating movements and fine finger movements are intact.  There is no dysmetria on finger-to-nose and heel-knee-shin.    GAIT/STANCE: Posture is normal. Gait is steady with normal steps, base, arm swing, and turning. Heel and toe walking are normal. Tandem gait is normal.  Romberg is absent.   DIAGNOSTIC DATA (LABS, IMAGING, TESTING) - I reviewed patient records, labs, notes, testing and imaging myself where available.   ASSESSMENT AND PLAN  Jacob Figueroa is a 81 y.o. male    Mild cognitive impairment  Laboratory evaluations showed no treatable etiology  Continue to address vascular risk factor  Continue Namenda 10 mg twice a day, Aricept 10 mg daily  Jacob Figueroa, M.D. Ph.D.  [facility] [address] [place], [place] [address] Ph: [phone] Fax: [phone]  CC: Jacob Mink, MD, Cassandria Anger, MD,

## 2017-06-26 ENCOUNTER — Encounter: Payer: Self-pay | Admitting: Neurology

## 2017-06-27 ENCOUNTER — Other Ambulatory Visit: Payer: Self-pay | Admitting: *Deleted

## 2017-06-27 MED ORDER — MEMANTINE HCL-DONEPEZIL HCL ER 28-10 MG PO CP24: 1.0000 | ORAL_CAPSULE | Freq: Every day | ORAL | 3 refills | Status: AC

## 2017-06-27 MED ORDER — DONEPEZIL HCL 10 MG PO TABS: 10.0000 mg | ORAL_TABLET | Freq: Every day | ORAL | 3 refills | Status: DC

## 2017-06-27 MED ORDER — MEMANTINE HCL 10 MG PO TABS: 10.0000 mg | ORAL_TABLET | Freq: Two times a day (BID) | ORAL | 3 refills | Status: DC

## 2017-07-11 ENCOUNTER — Other Ambulatory Visit: Payer: Self-pay | Admitting: Internal Medicine

## 2017-07-12 ENCOUNTER — Other Ambulatory Visit: Payer: Self-pay | Admitting: General Practice

## 2017-07-12 MED ORDER — TAMSULOSIN HCL 0.4 MG PO CAPS: 0.4000 mg | ORAL_CAPSULE | Freq: Every day | ORAL | 0 refills | Status: AC

## 2017-08-01 ENCOUNTER — Other Ambulatory Visit: Payer: Self-pay | Admitting: Internal Medicine

## 2017-08-22 ENCOUNTER — Other Ambulatory Visit: Payer: Self-pay | Admitting: Internal Medicine

## 2017-08-23 ENCOUNTER — Other Ambulatory Visit: Payer: Self-pay | Admitting: Internal Medicine

## 2017-09-01 IMAGING — MR MR HEAD WO/W CM
17 of 18 series · 43 of 48 positions shown · IV contrast (multihance)
Comparison: CT head 11/07/2014

CLINICAL DATA: Combined forms of age-related cataract.

Vision loss right eye.  Amaurosis fugax right eye
Creatinine was obtained on site at [HOSPITAL] at [HOSPITAL].Results: Creatinine 1.0 mg/dL.
EXAM:
MRI HEAD AND ORBITS WITHOUT AND WITH CONTRAST
TECHNIQUE: Multiplanar, multiecho pulse sequences of the brain and surrounding
structures were obtained without and with intravenous contrast.
Multiplanar, multiecho pulse sequences of the orbits and surrounding
structures were obtained including fat saturation techniques, before
and after intravenous contrast administration.
CONTRAST:  16mL MULTIHANCE GADOBENATE DIMEGLUMINE 529 MG/ML IV SOLN

[Series 2: T1 · sagittal · 5.0mm · 0.45mm/px · 2 of 21 slices shown (1 of 3)]
[im 1/21]
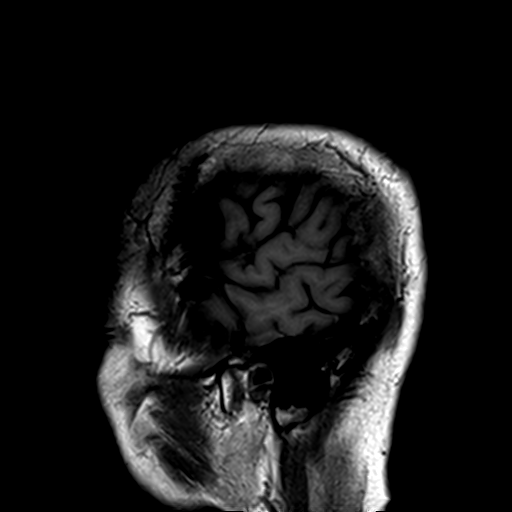
[im 21/21]
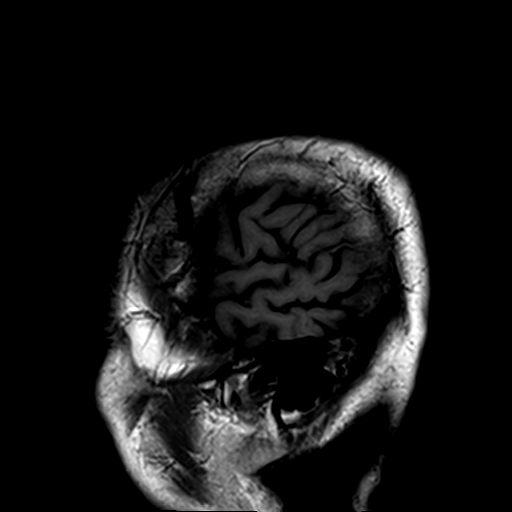

[Series 3: T2 · axial · 5.0mm · 0.51mm/px · 1 of 23 slices shown (1 of 2)]
[im 1/23]
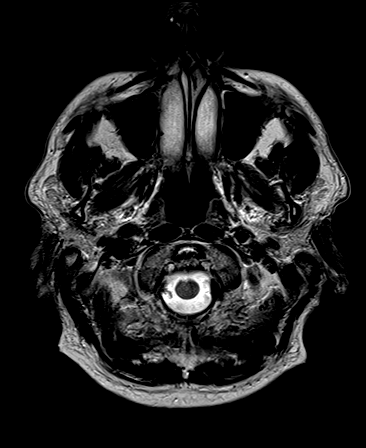

[Series 4: DWI · axial · 3.0mm · 1.80mm/px · z∈[-51,+102]mm · 6 of 104 slices shown (1 of 4)]
[im 1/104]
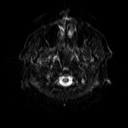
[im 21/104]
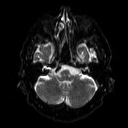
[im 42/104]
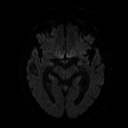
[im 62/104]
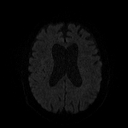
[im 83/104]
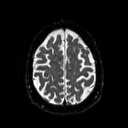
[im 104/104]
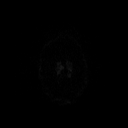

[Series 5: DWI · axial · 3.0mm · 1.80mm/px · z∈[-51,+102]mm · 3 of 52 slices shown (2 of 4)]
[im 1/52]
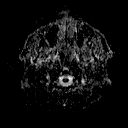
[im 26/52]
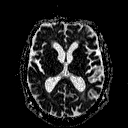
[im 52/52]
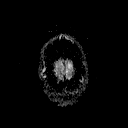

[Series 6: T1 · coronal · 3.0mm · 0.35mm/px · 1 of 24 slices shown (2 of 3)]
[im 1/24]
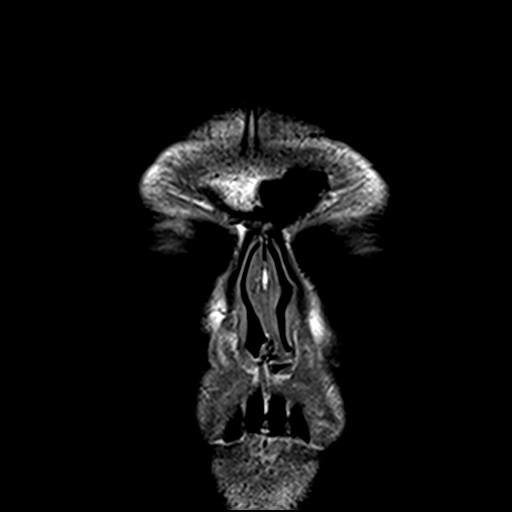

[Series 7: T2 fat-sat · coronal · 3.0mm · 0.35mm/px · 1 of 24 slices shown (1 of 2)]
[im 1/24]
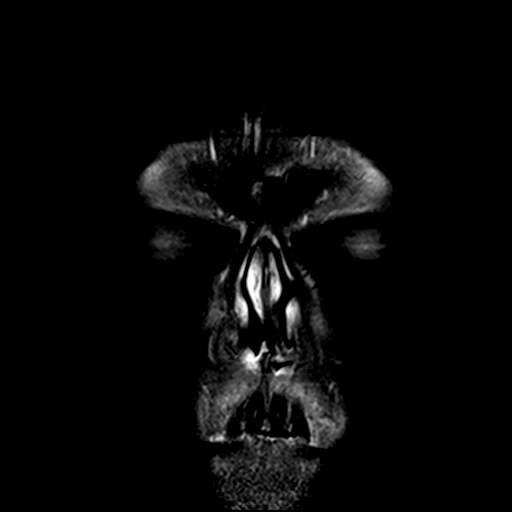

[Series 8: DWI · coronal · 5.0mm · 1.80mm/px · 4 of 67 slices shown (3 of 4)]
[im 1/67]
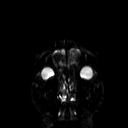
[im 23/67]
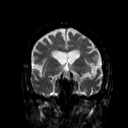
[im 45/67]
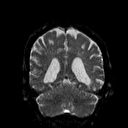
[im 67/67]
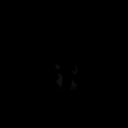

[Series 9: DWI · coronal · 5.0mm · 1.80mm/px · 2 of 34 slices shown (4 of 4)]
[im 1/34]
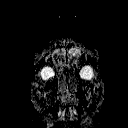
[im 34/34]
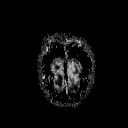

[Series 10: FLAIR · axial · 3.0mm · 0.45mm/px · z∈[-50,+101]mm · 2 of 30 slices shown]
[im 1/30]
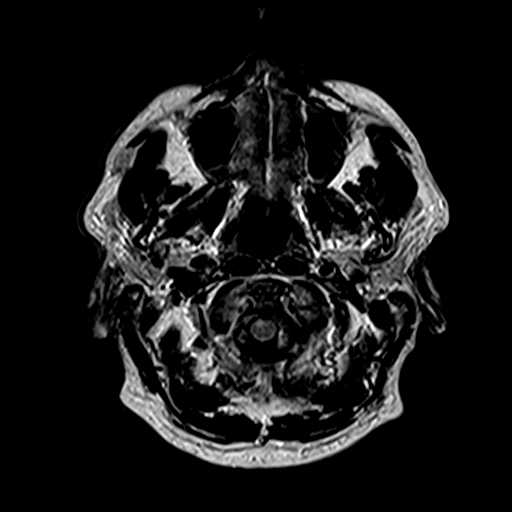
[im 30/30]
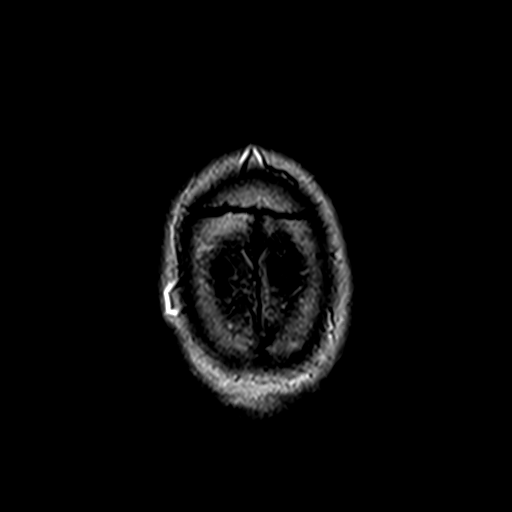

[Series 12: swi_images · axial · 2.0mm · 0.90mm/px · z∈[-53,+104]mm · 4 of 80 slices shown]
[im 1/80]
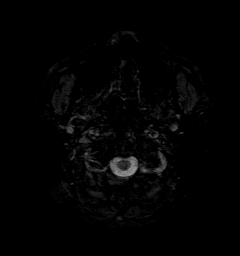
[im 27/80]
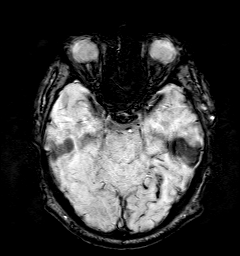
[im 53/80]
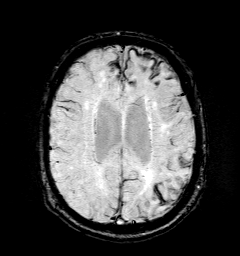
[im 80/80]
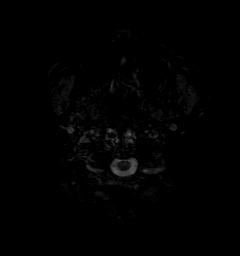

[Series 13: t1_mpr_tra · axial · 1.1mm · 0.75mm/px · z∈[-51,+105]mm · 8 of 144 slices shown (1 of 2)]
[im 1/144]
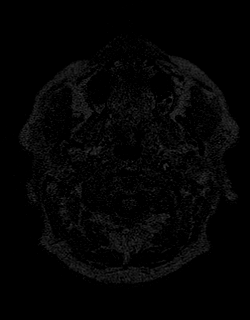
[im 21/144]
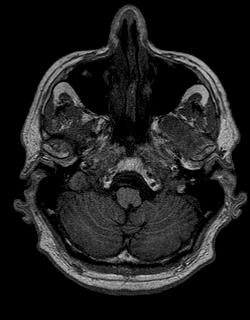
[im 41/144]
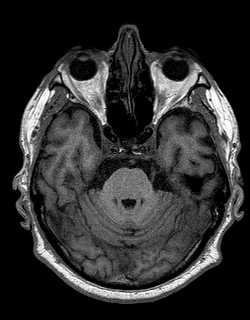
[im 62/144]
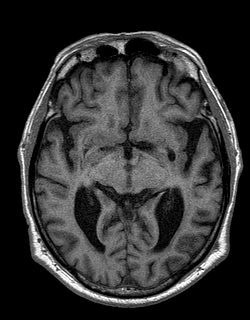
[im 82/144]
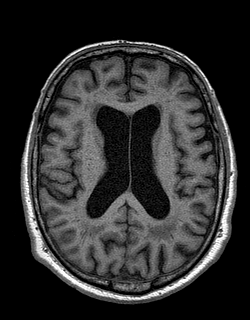
[im 103/144]
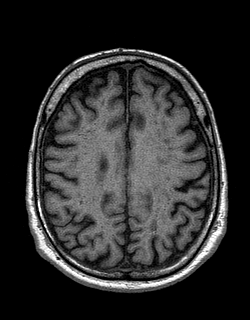
[im 123/144]
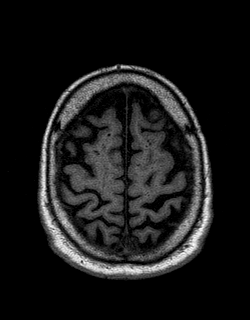
[im 144/144]
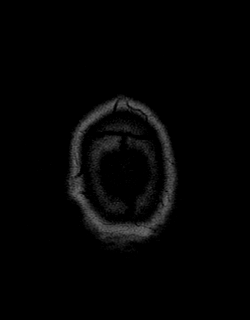

[Series 14: T1 · axial · 3.0mm · 0.35mm/px · 1 of 18 slices shown (3 of 3)]
[im 1/18]
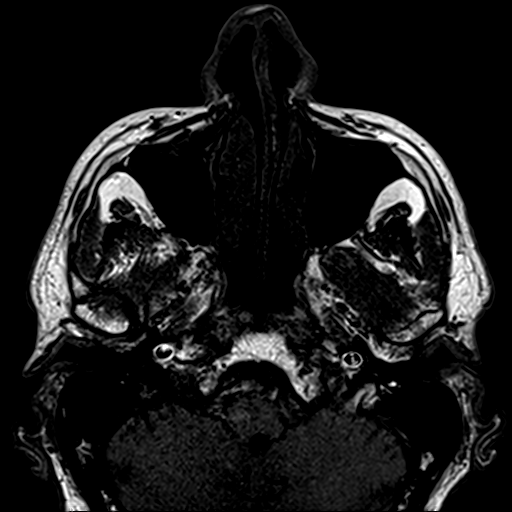

[Series 15: T2 fat-sat · axial · 3.0mm · 0.35mm/px · 1 of 18 slices shown (2 of 2)]
[im 1/18]
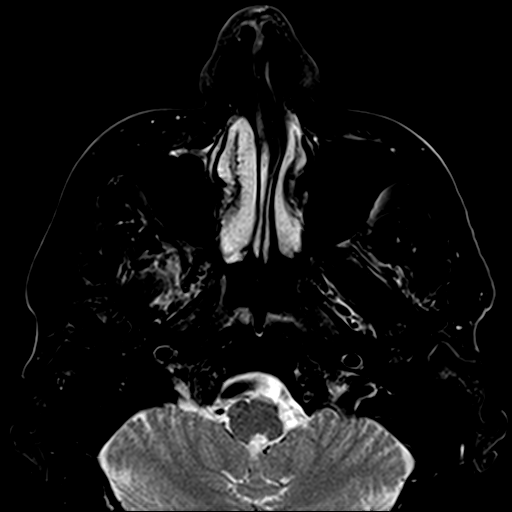

[Series 16: T2 · coronal · 5.0mm · 0.45mm/px · 1 of 26 slices shown (2 of 2)]
[im 1/26]
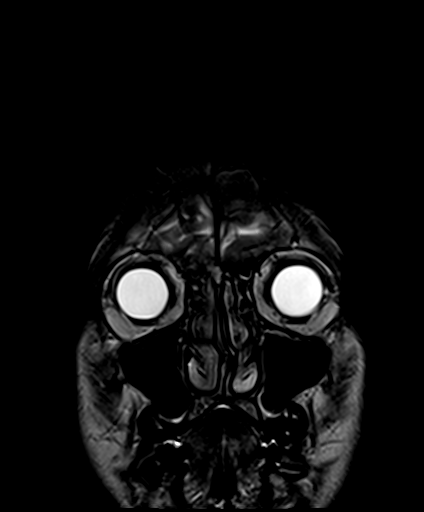

[Series 17: t1_mpr_tra · axial · 1.1mm · 0.75mm/px · z∈[-51,+15]mm · 4 of 144 slices shown (2 of 2)]
[im 1/144]
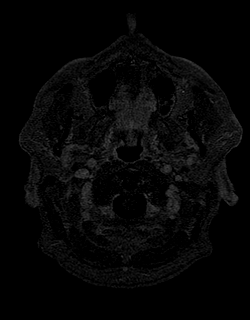
[im 21/144]
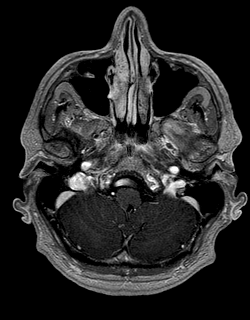
[im 41/144]
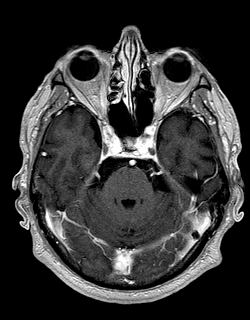
[im 62/144]
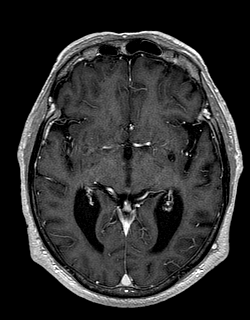

[Series 19: T1 fat-sat post-contrast · coronal · 3.0mm · 0.35mm/px · 1 of 24 slices shown (1 of 2)]
[im 1/24]
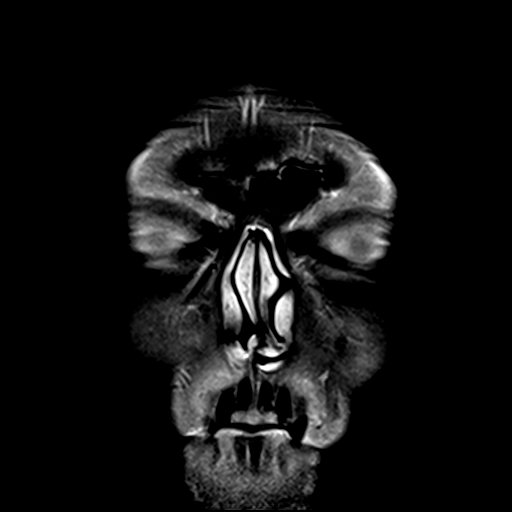

[Series 20: T1 fat-sat post-contrast · axial · 3.0mm · 0.35mm/px · 1 of 18 slices shown (2 of 2)]
[im 1/18]
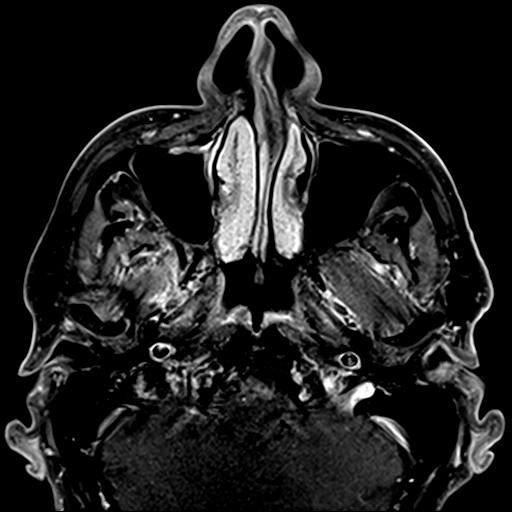

[43 of 48 positions shown; findings below may reference images not displayed]

FINDINGS: MRI HEAD FINDINGS

Brain: Moderate atrophy. Negative for hydrocephalus. Ventricular
enlargement consistent with the level of atrophy. Negative for acute
infarct. Scattered periventricular and deep white matter
hyperintensities most consistent with chronic microvascular
ischemia. Brainstem and cerebellum normal.

Superficial siderosis. There is chronic iron deposition along the
surface of both cerebral hemispheres and in the posterior fossa.
This is consistent with chronic subarachnoid hemorrhage. No
underlying mass or vascular malformation is identified on the
studies. Question history of prior trauma. No fluid collection.
Negative for mass or edema. Normal enhancement following contrast
infusion. No enhancing mass lesion. Leptomeningeal enhancement is
normal without dural thickening.

Vascular: Fusiform dilatation of the right M1 segment likely
atherosclerotic.

Skull and upper cervical spine: Negative

Other: None

MRI ORBITS FINDINGS

Orbits: Globe is normal and shape and signal bilaterally. Optic
nerve normal in size and signal.

There is moderate circumferential enhancement of the optic nerve
sheath on the right. Minimal enhancement of the left optic nerve
sheath. There is slight stranding in the orbital fat around the
optic nerve on the right. No enhancing mass lesion.

Extraocular muscles normal. Lacrimal gland normal. No mass lesion in
the orbit.

Visualized sinuses: Mild mucosal edema in the ethmoid and maxillary
sinuses.

Soft tissues: Negative
IMPRESSION: Atrophy and chronic microvascular ischemia. No acute intracranial
abnormality.

Moderately advanced superficial superficial siderosis most
consistent with chronic subarachnoid hemorrhage of unknown etiology.

Circumferential enhancement of the optic nerve sheath bilaterally
right greater than left. Differential diagnosis considerations would
include inflammation related to superficial siderosis. Orbital
pseudotumor also possible. No underlying mass lesion identified.

These results were called by telephone at the time of interpretation
on 01/31/2017 at [DATE] to Dr. DJAKA NIEL , who verbally
acknowledged these results.

## 2017-09-06 DIAGNOSIS — M545 Low back pain: Secondary | ICD-10-CM | POA: Diagnosis not present

## 2017-09-06 DIAGNOSIS — M25551 Pain in right hip: Secondary | ICD-10-CM | POA: Diagnosis not present

## 2017-09-07 DIAGNOSIS — Z23 Encounter for immunization: Secondary | ICD-10-CM | POA: Diagnosis not present

## 2017-09-12 DIAGNOSIS — M545 Low back pain: Secondary | ICD-10-CM | POA: Diagnosis not present

## 2017-09-15 DIAGNOSIS — M545 Low back pain: Secondary | ICD-10-CM | POA: Diagnosis not present

## 2017-09-20 DIAGNOSIS — M5136 Other intervertebral disc degeneration, lumbar region: Secondary | ICD-10-CM | POA: Diagnosis not present

## 2017-09-20 DIAGNOSIS — M5137 Other intervertebral disc degeneration, lumbosacral region: Secondary | ICD-10-CM | POA: Diagnosis not present

## 2017-09-20 DIAGNOSIS — M5116 Intervertebral disc disorders with radiculopathy, lumbar region: Secondary | ICD-10-CM | POA: Diagnosis not present

## 2017-09-20 DIAGNOSIS — M9903 Segmental and somatic dysfunction of lumbar region: Secondary | ICD-10-CM | POA: Diagnosis not present

## 2017-09-22 DIAGNOSIS — M5136 Other intervertebral disc degeneration, lumbar region: Secondary | ICD-10-CM | POA: Diagnosis not present

## 2017-09-22 DIAGNOSIS — M5116 Intervertebral disc disorders with radiculopathy, lumbar region: Secondary | ICD-10-CM | POA: Diagnosis not present

## 2017-09-22 DIAGNOSIS — M9903 Segmental and somatic dysfunction of lumbar region: Secondary | ICD-10-CM | POA: Diagnosis not present

## 2017-09-22 DIAGNOSIS — M5137 Other intervertebral disc degeneration, lumbosacral region: Secondary | ICD-10-CM | POA: Diagnosis not present

## 2017-09-23 DIAGNOSIS — M5136 Other intervertebral disc degeneration, lumbar region: Secondary | ICD-10-CM | POA: Diagnosis not present

## 2017-09-23 DIAGNOSIS — M5116 Intervertebral disc disorders with radiculopathy, lumbar region: Secondary | ICD-10-CM | POA: Diagnosis not present

## 2017-09-23 DIAGNOSIS — M9903 Segmental and somatic dysfunction of lumbar region: Secondary | ICD-10-CM | POA: Diagnosis not present

## 2017-09-23 DIAGNOSIS — M5137 Other intervertebral disc degeneration, lumbosacral region: Secondary | ICD-10-CM | POA: Diagnosis not present

## 2017-09-26 DIAGNOSIS — M5116 Intervertebral disc disorders with radiculopathy, lumbar region: Secondary | ICD-10-CM | POA: Diagnosis not present

## 2017-09-26 DIAGNOSIS — M9903 Segmental and somatic dysfunction of lumbar region: Secondary | ICD-10-CM | POA: Diagnosis not present

## 2017-09-26 DIAGNOSIS — M5136 Other intervertebral disc degeneration, lumbar region: Secondary | ICD-10-CM | POA: Diagnosis not present

## 2017-09-26 DIAGNOSIS — M5137 Other intervertebral disc degeneration, lumbosacral region: Secondary | ICD-10-CM | POA: Diagnosis not present

## 2017-09-27 DIAGNOSIS — M5136 Other intervertebral disc degeneration, lumbar region: Secondary | ICD-10-CM | POA: Diagnosis not present

## 2017-09-27 DIAGNOSIS — M5137 Other intervertebral disc degeneration, lumbosacral region: Secondary | ICD-10-CM | POA: Diagnosis not present

## 2017-09-27 DIAGNOSIS — M9903 Segmental and somatic dysfunction of lumbar region: Secondary | ICD-10-CM | POA: Diagnosis not present

## 2017-09-27 DIAGNOSIS — M5116 Intervertebral disc disorders with radiculopathy, lumbar region: Secondary | ICD-10-CM | POA: Diagnosis not present

## 2017-09-28 DIAGNOSIS — M5116 Intervertebral disc disorders with radiculopathy, lumbar region: Secondary | ICD-10-CM | POA: Diagnosis not present

## 2017-09-28 DIAGNOSIS — M5136 Other intervertebral disc degeneration, lumbar region: Secondary | ICD-10-CM | POA: Diagnosis not present

## 2017-09-28 DIAGNOSIS — M9903 Segmental and somatic dysfunction of lumbar region: Secondary | ICD-10-CM | POA: Diagnosis not present

## 2017-09-28 DIAGNOSIS — M5137 Other intervertebral disc degeneration, lumbosacral region: Secondary | ICD-10-CM | POA: Diagnosis not present

## 2017-10-03 DIAGNOSIS — M5136 Other intervertebral disc degeneration, lumbar region: Secondary | ICD-10-CM | POA: Diagnosis not present

## 2017-10-03 DIAGNOSIS — M9903 Segmental and somatic dysfunction of lumbar region: Secondary | ICD-10-CM | POA: Diagnosis not present

## 2017-10-03 DIAGNOSIS — M5116 Intervertebral disc disorders with radiculopathy, lumbar region: Secondary | ICD-10-CM | POA: Diagnosis not present

## 2017-10-03 DIAGNOSIS — M5137 Other intervertebral disc degeneration, lumbosacral region: Secondary | ICD-10-CM | POA: Diagnosis not present

## 2017-10-11 ENCOUNTER — Other Ambulatory Visit: Payer: Self-pay | Admitting: Internal Medicine

## 2017-10-11 DIAGNOSIS — M5116 Intervertebral disc disorders with radiculopathy, lumbar region: Secondary | ICD-10-CM | POA: Diagnosis not present

## 2017-10-11 DIAGNOSIS — M5137 Other intervertebral disc degeneration, lumbosacral region: Secondary | ICD-10-CM | POA: Diagnosis not present

## 2017-10-11 DIAGNOSIS — M5136 Other intervertebral disc degeneration, lumbar region: Secondary | ICD-10-CM | POA: Diagnosis not present

## 2017-10-11 DIAGNOSIS — M9903 Segmental and somatic dysfunction of lumbar region: Secondary | ICD-10-CM | POA: Diagnosis not present

## 2017-10-16 ENCOUNTER — Other Ambulatory Visit: Payer: Self-pay | Admitting: Internal Medicine

## 2017-11-01 DIAGNOSIS — W108XXA Fall (on) (from) other stairs and steps, initial encounter: Secondary | ICD-10-CM | POA: Diagnosis not present

## 2017-11-01 DIAGNOSIS — R4182 Altered mental status, unspecified: Secondary | ICD-10-CM | POA: Diagnosis not present

## 2017-11-01 DIAGNOSIS — W109XXA Fall (on) (from) unspecified stairs and steps, initial encounter: Secondary | ICD-10-CM | POA: Diagnosis not present

## 2017-11-01 DIAGNOSIS — R4020 Unspecified coma: Secondary | ICD-10-CM | POA: Diagnosis not present

## 2017-11-01 DIAGNOSIS — Y92009 Unspecified place in unspecified non-institutional (private) residence as the place of occurrence of the external cause: Secondary | ICD-10-CM | POA: Diagnosis not present

## 2017-11-01 DIAGNOSIS — S06360A Traumatic hemorrhage of cerebrum, unspecified, without loss of consciousness, initial encounter: Secondary | ICD-10-CM | POA: Diagnosis not present

## 2017-11-01 DIAGNOSIS — S12100A Unspecified displaced fracture of second cervical vertebra, initial encounter for closed fracture: Secondary | ICD-10-CM | POA: Diagnosis not present

## 2017-11-01 DIAGNOSIS — M542 Cervicalgia: Secondary | ICD-10-CM | POA: Diagnosis not present

## 2017-11-01 DIAGNOSIS — T1490XA Injury, unspecified, initial encounter: Secondary | ICD-10-CM | POA: Diagnosis not present

## 2017-11-01 DIAGNOSIS — S069X9A Unspecified intracranial injury with loss of consciousness of unspecified duration, initial encounter: Secondary | ICD-10-CM | POA: Diagnosis not present

## 2017-11-01 DIAGNOSIS — S0003XA Contusion of scalp, initial encounter: Secondary | ICD-10-CM | POA: Diagnosis not present

## 2017-11-01 DIAGNOSIS — I629 Nontraumatic intracranial hemorrhage, unspecified: Secondary | ICD-10-CM | POA: Diagnosis not present

## 2017-11-01 DIAGNOSIS — S0990XA Unspecified injury of head, initial encounter: Secondary | ICD-10-CM | POA: Diagnosis not present

## 2017-11-01 DIAGNOSIS — S0101XA Laceration without foreign body of scalp, initial encounter: Secondary | ICD-10-CM | POA: Diagnosis not present

## 2017-11-01 DIAGNOSIS — S06369A Traumatic hemorrhage of cerebrum, unspecified, with loss of consciousness of unspecified duration, initial encounter: Secondary | ICD-10-CM | POA: Diagnosis not present

## 2017-11-01 DIAGNOSIS — S02113A Unspecified occipital condyle fracture, initial encounter for closed fracture: Secondary | ICD-10-CM | POA: Diagnosis not present

## 2017-11-01 DIAGNOSIS — S0181XA Laceration without foreign body of other part of head, initial encounter: Secondary | ICD-10-CM | POA: Diagnosis not present

## 2017-11-08 DEATH — deceased

## 2017-11-10 ENCOUNTER — Encounter: Payer: Self-pay | Admitting: Neurology

## 2017-11-10 ENCOUNTER — Telehealth: Payer: Self-pay | Admitting: Internal Medicine

## 2017-11-10 NOTE — Telephone Encounter (Signed)
Pt deceased 2023/11/17

## 2017-11-25 ENCOUNTER — Encounter: Payer: Self-pay | Admitting: Internal Medicine

## 2018-06-19 ENCOUNTER — Ambulatory Visit: Payer: Medicare Other | Admitting: Nurse Practitioner
